# Patient Record
Sex: Female | Born: 1995 | Race: White | Hispanic: No | Marital: Single | Smoking: Never smoker
Health system: Southern US, Community
[De-identification: ages and names within clinical notes are randomized; demographics above are authoritative.]

## PROBLEM LIST (undated history)

## (undated) DIAGNOSIS — K2210 Ulcer of esophagus without bleeding: Secondary | ICD-10-CM

## (undated) DIAGNOSIS — E611 Iron deficiency: Secondary | ICD-10-CM

## (undated) HISTORY — PX: ESOPHAGOGASTRODUODENOSCOPY: SHX1529

---
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
## 2015-10-05 ENCOUNTER — Emergency Department (HOSPITAL_BASED_OUTPATIENT_CLINIC_OR_DEPARTMENT_OTHER): Payer: BLUE CROSS/BLUE SHIELD

## 2015-10-05 ENCOUNTER — Emergency Department (HOSPITAL_BASED_OUTPATIENT_CLINIC_OR_DEPARTMENT_OTHER)
Admission: EM | Admit: 2015-10-05 | Discharge: 2015-10-05 | Disposition: A | Discharge status: Home / Self Care | Payer: BLUE CROSS/BLUE SHIELD | Source: Home / Self Care | Attending: Emergency Medicine | Admitting: Emergency Medicine

## 2015-10-05 ENCOUNTER — Encounter (HOSPITAL_BASED_OUTPATIENT_CLINIC_OR_DEPARTMENT_OTHER): Payer: Self-pay | Admitting: *Deleted

## 2015-10-05 DIAGNOSIS — K208 Other esophagitis: Secondary | ICD-10-CM | POA: Diagnosis not present

## 2015-10-05 DIAGNOSIS — R1310 Dysphagia, unspecified: Secondary | ICD-10-CM | POA: Diagnosis not present

## 2015-10-05 DIAGNOSIS — J020 Streptococcal pharyngitis: Secondary | ICD-10-CM

## 2015-10-05 LAB — BASIC METABOLIC PANEL
Anion gap: 6 (ref 5–15)
BUN: 19 mg/dL (ref 6–20)
CO2: 29 mmol/L (ref 22–32)
Calcium: 8.7 mg/dL — ABNORMAL LOW (ref 8.9–10.3)
Chloride: 103 mmol/L (ref 101–111)
Creatinine, Ser: 0.98 mg/dL (ref 0.44–1.00)
GFR calc Af Amer: >60 mL/min (ref >60)
GFR calc non Af Amer: >60 mL/min (ref >60)
Glucose, Bld: 97 mg/dL (ref 65–99)
Potassium: 3.9 mmol/L (ref 3.5–5.1)
Sodium: 138 mmol/L (ref 135–145)

## 2015-10-05 LAB — RAPID STREP SCREEN (MED CTR MEBANE ONLY): Streptococcus, Group A Screen (Direct): NEGATIVE

## 2015-10-05 MED ORDER — MORPHINE SULFATE (PF) 4 MG/ML IV SOLN
4.0000 mg | Freq: Once | INTRAVENOUS | Status: AC
Start: 2015-10-05 — End: 2015-10-05
  Administered 2015-10-05: 4 mg via INTRAVENOUS
  Filled 2015-10-05: qty 1

## 2015-10-05 MED ORDER — ACETAMINOPHEN 160 MG/5ML PO SOLN
650.0000 mg | Freq: Once | ORAL | Status: AC
  Administered 2015-10-05: 650 mg via ORAL
  Filled 2015-10-05: qty 20.3

## 2015-10-05 MED ORDER — IOHEXOL 300 MG/ML  SOLN
80.0000 mL | Freq: Once | INTRAMUSCULAR | Status: AC | PRN
  Administered 2015-10-05: 80 mL via INTRAVENOUS

## 2015-10-05 MED ORDER — LIDOCAINE VISCOUS 2 % MT SOLN
15.0000 mL | Freq: Once | OROMUCOSAL | Status: AC
  Administered 2015-10-05: 15 mL via OROMUCOSAL
  Filled 2015-10-05: qty 15

## 2015-10-05 MED ORDER — FAMOTIDINE 20 MG PO TABS: 20.0000 mg | ORAL_TABLET | Freq: Two times a day (BID) | ORAL | Status: DC

## 2015-10-05 MED ORDER — KETOROLAC TROMETHAMINE 30 MG/ML IJ SOLN
30.0000 mg | Freq: Once | INTRAMUSCULAR | Status: AC
  Administered 2015-10-05: 30 mg via INTRAVENOUS
  Filled 2015-10-05: qty 1

## 2015-10-05 MED ORDER — KETOROLAC TROMETHAMINE 10 MG PO TABS: 10.0000 mg | ORAL_TABLET | Freq: Four times a day (QID) | ORAL | Status: DC | PRN

## 2015-10-05 MED ORDER — ONDANSETRON HCL 4 MG/2ML IJ SOLN
4.0000 mg | Freq: Once | INTRAMUSCULAR | Status: AC
  Administered 2015-10-05: 4 mg via INTRAVENOUS
  Filled 2015-10-05: qty 2

## 2015-10-05 MED ORDER — ACETAMINOPHEN 325 MG PO TABS
650.0000 mg | ORAL_TABLET | Freq: Once | ORAL | Status: DC
Start: 2015-10-05 — End: 2015-10-05
  Filled 2015-10-05: qty 2

## 2015-10-05 MED ORDER — LIDOCAINE VISCOUS 2 % MT SOLN: 20.0000 mL | OROMUCOSAL | Status: DC | PRN

## 2015-10-05 MED ORDER — SODIUM CHLORIDE 0.9 % IV BOLUS (SEPSIS)
1000.0000 mL | Freq: Once | INTRAVENOUS | Status: AC
  Administered 2015-10-05: 1000 mL via INTRAVENOUS

## 2015-10-05 MED ORDER — FAMOTIDINE IN NACL 20-0.9 MG/50ML-% IV SOLN
20.0000 mg | Freq: Once | INTRAVENOUS | Status: AC
  Administered 2015-10-05: 20 mg via INTRAVENOUS
  Filled 2015-10-05: qty 50

## 2015-10-05 NOTE — Discharge Instructions (Signed)
Take your medications as prescribed. Continue taking your prescription for amoxicillin as prescribed until your finished the course of antibiotics. I recommend following up with ENT if you continue to have pain after taking a course of antibiotics. Return to the emergency department if symptoms worsen or new onset of fever, drooling, difficulty breathing, coughing up/vomiting blood.   Emergency Department Resource Guide 1) Find a Doctor and Pay Out of Pocket Although you won't have to find out who is covered by your insurance plan, it is a good idea to ask around and get recommendations. You will then need to call the office and see if the doctor you have chosen will accept you as a new patient and what types of options they offer for patients who are self-pay. Some doctors offer discounts or will set up payment plans for their patients who do not have insurance, but you will need to ask so you aren't surprised when you get to your appointment.  2) Contact Your Local Health Department Not all health departments have doctors that can see patients for sick visits, but many do, so it is worth a call to see if yours does. If you don't know where your local health department is, you can check in your phone book. The CDC also has a tool to help you locate your state's health department, and many state websites also have listings of all of their local health departments.  3) Find a Walk-in Clinic If your illness is not likely to be very severe or complicated, you may want to try a walk in clinic. These are popping up all over the country in pharmacies, drugstores, and shopping centers. They're usually staffed by nurse practitioners or physician assistants that have been trained to treat common illnesses and complaints. They're usually fairly quick and inexpensive. However, if you have serious medical issues or chronic medical problems, these are probably not your best option.  No Primary Care Doctor: - Call  Health Connect at  [phone] - they can help you locate a primary care doctor that  accepts your insurance, provides certain services, etc. - Physician Referral Service- [phone]  Chronic Pain Problems: Organization         Address  Phone   Notes  [facility]  [phone] Patients need to be referred by their primary care doctor.   Medication Assistance: Organization         Address  Phone   Notes  [facility] [address] [place], [place] [address] [phone] --Must be a resident of [place] -- Must have NO insurance coverage whatsoever (no Medicaid/ Medicare, etc.) -- The pt. MUST have a primary care doctor that directs their care regularly and follows them in the community   MedAssist  [phone]   [employer]  [phone]) [phone]    Agencies that provide inexpensive medical care: Organization         Address  Phone   Notes  [facility]  [phone]   [facility]    [phone]) [phone]   [facility] [address] [place], [place] [address] [phone]   [facility] [place] [address], [place] [phone]   Planned Parenthood    [phone]   [facility]    [phone]) [phone]   [facility]  [address],  Phone:  [phone]) [phone], Fax:  (  [phone] Hours of Operation:  9 am - 6 pm, M-F.  Also accepts Medicaid/Medicare and self-pay.  [facility]  [address], [place] Phone: [phone], Fax: [phone]. Hours of Operation:  8:30 am - 5:30 pm, M-F.  Also accepts Medicaid and self-pay.  [facility] [place] [address] [place] Phone: [phone]   [facility] [address] Natasha Bence [place], [place] [phone], Ext. [phone] Mondays & Thursdays: 7-9 AM.  First 15 patients are seen on a first come, first serve  basis.    Medicaid-accepting [facility] Providers:  Organization         Address  Phone   Notes  [facility] [address], [place] [phone] Also accepts self-pay patients.  [facility] [address] Laurell Josephs [place], [place]  [phone]   [facility] [address], [place] [phone]   [facility] [address] [place] [phone]   Renaye Rakers [address], [place]   [phone] Only accepts [place] patients after they have their name applied to their card.   Self-Pay (no insurance) in [facility]:  Organization         Address  Phone   Notes  Sickle Cell Patients, [facility] [address] [employer] [place], [place] [phone]   [facility] [address] [place], [place] [phone]   [facility]  [address], [place] [phone]   [facility]/Dr. Osei-Bonsu  [address], [place] or [address], [place] [phone] Phone number for both [place] and [place] locations is the same.  [facility] [address] [place] [phone]   [facility] [address], [place] or [address] [phone]   [facility] [address], [place] [phone], phone; [phone], fax Sees patients 1st and 3rd Saturday of every month.  Must not qualify for public or private insurance (i.e. Medicaid, Medicare, [place] Health Choice, Veterans' Benefits)  Household income should be no more than 200% of the poverty level The clinic cannot treat you if you are pregnant or think you are pregnant  Sexually transmitted diseases are not treated at the clinic.    Dental Care: Organization         Address  Phone  Notes  [facility] [address] [place], [place] [phone] Accepts children up to age 42 who are enrolled in [place] or [place] Health Choice; pregnant women with a Medicaid card; and children who have applied for Medicaid or [place] Health Choice, but were declined, whose parents can pay a reduced fee at time of service.  [facility]  [address], [place] [phone] Accepts children up to age 69 who are enrolled in [place] or [place] Health Choice; pregnant women with a Medicaid card; and children who have applied for Medicaid or [place] Health Choice, but were declined, whose parents can pay a reduced fee at time of service.  [facility]  [address] [place], [place] [phone] Patients are seen by appointment only. Walk-ins are not accepted. [facility] will see patients 76 years of age and older. Monday - Tuesday (8am-5pm) Most Wednesdays (8:30-5pm) $30 per visit, cash only  [facility]  [address], [facility] [phone] Patients are seen by appointment only. Walk-ins are not accepted. [place] will see patients 23 years of age and older. One Wednesday Evening (Monthly: Volunteer Based).  $30 per visit, cash only  [place]  [phone] for adults; Children under age 29, call Graduate Pediatric Dentistry at [phone]. Children aged 14-14, please call [phone] to request a pediatric application.  Dental services are provided in all areas of dental care including fillings, crowns and bridges, complete and partial dentures, implants, gum treatment, root canals, and extractions. Preventive care is also provided. Treatment is provided to both adults and children. Patients are selected via a lottery and there is often a waiting list.   [facility] [address] [place]  [phone]  [email or web address]   [facility] [address] [place], [place] [phone], Ext. [phone] Second and Fourth Thursday of each month, opens at 6:30 AM; Clinic ends at 9 AM.  Patients are seen on a first-come first-served basis, and a limited number are seen during each clinic.   [facility]  [address] Hillard Danker [place], [place] [phone]   Eligibility Requirements You must have lived in [place], [place], or [place] counties for at least the last three months.   You cannot be eligible for state or federal sponsored [employer], including [employer], [place], or [employer].   You generally cannot be eligible for healthcare insurance through your employer.    How to apply: Eligibility screenings are held every Tuesday and Wednesday afternoon from 1:00 pm until 4:00 pm. You do not need an appointment for the interview!  [facility] [address] [place], [place]   [place]  [place] Department  [place]  [phone]    Behavioral Health Resources in the Community: Intensive Outpatient Programs Organization         Address  Phone  Notes  [place]. [address], [place], [place] [phone]   [facility] Outpatient [address] [place], [place]   ADS: Alcohol & Drug Svcs [address], [place], [place]   [place] [address]  [place], [place] or [phone]   Substance Abuse Resources Organization         Address  Phone  Notes  Alcohol and Drug Services  [phone]   [place]  [phone]   The [place]   Chinita Pester  [phone]   Residential & Outpatient Substance Abuse Program  [phone]   Psychological Services Organization          Address  Phone  Notes  [facility] [place]  [place]  [phone]   [place] [address] [place] or [phone]    Mobile Crisis Teams Organization         Address  Phone  Notes  [facility], Mobile Crisis Care Unit  [phone]   [facility]  [address] [place], [place]   [facility] [address] [place] [phone]    Self-Help/Support Groups Organization  Address  Phone             Notes  [facility] of [place] - variety of support groups  [phone] Call for more information  Narcotics Anonymous (NA), [facility] [address], [place]  2 meetings at this location   Statisticianesidential Treatment Programs Organization         Address  Phone  Notes  [facility] [address],    [place]  [phone]   [facility]  [address], [place] [phone], [place], [place] [phone]   [facility] [address], [place] [phone] Admissions: 8am-3pm M-F  [facility] [address]    [place], [place] [phone]   [facility] [address], [place], [place] [phone]   [facility] [address]  [place], [place] [phone]   [facility] - Intensive Outpatient [address], [place], [place] [phone]   [facility] ([facility] Recovery [facility] [address]  [place], [place] [phone] or [phone]   [facility] (RTS) [address] [place], [place] [phone] Accepts Medicaid  [facility] [address]  [place] [phone] Substance Abuse/Addiction Treatment   [facility] Behavioral Health Resources Organization         Address  Phone  Notes  [facility]  [phone](888) [phone]   Angie FavaJulie Brannon, PhD [address], [address] [place], [place]   [phone]) [phone] or [phone]) [phone]   [facility]   [address] [place], [place] [phone](336) [phone]   [facility] [address], [place], [place] [phone] Insurance/Medicaid/sponsorship through [facility]  Faith and Families [address]                                    [place], [place] [phone]) [phone] Therapy/tele-psych/case  [facility] [address]   [place], [place] [phone]) [phone]    Dr. Lolly MustacheArfeen  [phone]) [phone]   [facility] [facility]  [employer] [facility] 1) [address], [place] 2) [address], [place] 3)  [address], [place] [phone](336) [phone]  [phone]   [facility] Child Abuse Hotline [phone]) [phone] or [phone]) [phone] (After Hours)

## 2015-10-05 NOTE — ED Provider Notes (Signed)
CSN: [id number]     Arrival date & time 10/05/15  1509 History   First MD Initiated Contact with Patient 10/05/15 1800     Chief Complaint  Patient presents with  . Sore Throat     (Consider location/radiation/quality/duration/timing/severity/associated sxs/prior Treatment) HPI   Patient is a 19 year old female who presents to the emergency department with worsening sore throat, onset 5 days. Patient states she recently came home from college started having a sore throat. She states she was seen at an urgent care 4 days ago and was given a Z-Pak. She states due to her symptoms worsening she return to the urgent care this morning, negative chest x-ray, unremarkable EKG, negative strep, negative mono. She was changed from a Z-Pak to amoxicillin and given IM steroids and diagnosed with pleurisy. Due to patient continuing to have pain she decided to come into the emergency department today. She reports having fever, chills, ear pain, nausea and headaches. She also reports having lower midsternal sharp chest pain with swallowing. Denies cough, difficulty breathing, drooling, facial/neck swelling, wheezing, palpitations, abdominal pain, vomiting, diarrhea, urinary symptoms. She notes she has been taking ibuprofen without relief.  History reviewed. No pertinent past medical history. History reviewed. No pertinent past surgical history. History reviewed. No pertinent family history. Social History  Substance Use Topics  . Smoking status: Never Smoker   . Smokeless tobacco: None  . Alcohol Use: No   OB History    No data available     Review of Systems  Constitutional: Positive for fever and chills.  HENT: Positive for ear pain and sore throat.   Respiratory: Positive for chest tightness.   Gastrointestinal: Positive for nausea.  Neurological: Positive for headaches.  All other systems reviewed and are negative.     Allergies  Review of patient's allergies indicates no known  allergies.  Home Medications   Prior to Admission medications   Medication Sig Start Date End Date Taking? Authorizing Provider  amoxicillin (AMOXIL) 875 MG tablet Take 875 mg by mouth 2 (two) times daily.   Yes Historical Provider, MD  famotidine (PEPCID) 20 MG tablet Take 1 tablet (20 mg total) by mouth 2 (two) times daily. 10/05/15   Barrett HenleNicole Elizabeth Montina Dorrance, PA-C  ketorolac (TORADOL) 10 MG tablet Take 1 tablet (10 mg total) by mouth every 6 (six) hours as needed. 10/05/15   Barrett HenleNicole Elizabeth Braven Wolk, PA-C  lidocaine (XYLOCAINE) 2 % solution Use as directed 20 mLs in the mouth or throat as needed for mouth pain. 10/05/15   Satira SarkNicole Elizabeth Raymundo Rout, PA-C   BP 121/69 mmHg  Pulse 66  Temp(Src) 98.6 F (37 C) (Oral)  Resp 18  Ht 5\' 6"  (1.676 m)  Wt 68.04 kg  BMI 24.22 kg/m2  SpO2 96%  LMP 09/09/2015 Physical Exam  Constitutional: She is oriented to person, place, and time. She appears well-developed and well-nourished.  HENT:  Head: Normocephalic and atraumatic.  Right Ear: Tympanic membrane normal.  Left Ear: Tympanic membrane normal.  Nose: Nose normal. Right sinus exhibits no maxillary sinus tenderness and no frontal sinus tenderness. Left sinus exhibits no maxillary sinus tenderness and no frontal sinus tenderness.  Mouth/Throat: Uvula is midline and mucous membranes are normal. Oropharyngeal exudate (white exudate noted to bilateral tonsils), posterior oropharyngeal edema and posterior oropharyngeal erythema present. No tonsillar abscesses.  Eyes: Conjunctivae and EOM are normal. Right eye exhibits no discharge. Left eye exhibits no discharge. No scleral icterus.  Neck: Normal range of motion. Neck supple.  Cardiovascular: Normal rate,  regular rhythm, normal heart sounds and intact distal pulses.   Pulmonary/Chest: Effort normal and breath sounds normal. No respiratory distress. She has no wheezes. She has no rales. She exhibits tenderness (midsternal chest wall mildly TTP).   Abdominal: Soft. Bowel sounds are normal. She exhibits no distension and no mass. There is no tenderness. There is no rebound and no guarding.  Musculoskeletal: She exhibits no edema.  Lymphadenopathy:    She has no cervical adenopathy.  Neurological: She is alert and oriented to person, place, and time.  Skin: Skin is warm and dry.  Nursing note and vitals reviewed.   ED Course  Procedures (including critical care time) Labs Review Labs Reviewed  BASIC METABOLIC PANEL - Abnormal; Notable for the following:    Calcium 8.7 (*)    All other components within normal limits  RAPID STREP SCREEN (NOT AT [facility])  CULTURE, GROUP A STREP    Imaging Review Ct Soft Tissue Neck W Contrast  10/05/2015  CLINICAL DATA:  Chest pain and sore throat EXAM: CT NECK WITH CONTRAST TECHNIQUE: Multidetector CT imaging of the neck was performed using the standard protocol following the bolus administration of intravenous contrast. CONTRAST:  80mL OMNIPAQUE IOHEXOL 300 MG/ML  SOLN COMPARISON:  None. FINDINGS: Pharynx and larynx: No mass or fluid collection identified. The prevertebral soft tissues appear normal. Salivary glands: The parotid glands and submandibular glands appear symmetric. Thyroid: Normal appearance of the thyroid gland. Lymph nodes: Right level-II lymph node measures 1.3 cm, image number 46 of series 2. Vascular: Patent.  Unremarkable. Limited intracranial: Unremarkable. Visualized orbits: The orbits appear normal. Mastoids and visualized paranasal sinuses: The mastoid air cells are clear. There is mild mucosal thickening involving the left maxillary sinus Skeleton: Unremarkable. Upper chest: Lungs are clear. Superior mediastinum appears within normal limits. IMPRESSION: 1. No mass or evidence of abscess identified within the soft tissues of the neck. 2. Enlarged right level 2 lymph node is identified. Likely reactive. Electronically Signed   By: Signa Kell M.D.   On: 10/05/2015 20:42   I have  personally reviewed and evaluated these images and lab results as part of my medical decision-making.   EKG Interpretation None      MDM   Final diagnoses:  Strep pharyngitis    Patient presents with worsening sore throat. Endorses associated fever and chest pain with swallowing. She is been seen multiple times in urgent care prior to arrival, negative strep, negative mono, unremarkable EKG and chest x-ray. She has been given IM steroids as recently switched from a Z-Pak to amoxicillin, took her first dose of antibiotic today. VSS. Exam revealed posterior oral pharyngeal swelling and erythema with white exudate noted to bilateral tonsils. No trismus, drooling, neck swelling or stridor on exam. Cardiac exam unremarkable. I was able to review patient's recent chest x-ray which was negative. Patient given IV fluids, pain meds and anti-medics. Rapid strep negative. CT revealed no evidence of abscess, enlarged right level II lymph nodes, otherwise unremarkable. Patient reports her pain has not improved, at that time patient also reports having history of GERD but notes she has not been on any medications. She states when she was in the CT scanner laying supine her chest pain worsen. Patient given second dose of pain meds and Pepcid. Discussed results with patient and family. Although patient's rapid strep was negative, presentation is consistent with strep pharyngitis. Plan to discharge patient home with symptomatic treatment and advised to continue taking her antibiotics as prescribed. Patient given ENT  follow-up as needed with associated continued pain.  Evaluation does not show pathology requring ongoing emergent intervention or admission. Pt is hemodynamically stable and mentating appropriately. Discussed findings/results and plan with patient/guardian, who agrees with plan. All questions answered. Return precautions discussed and outpatient follow up given.       Satira Sark [place],  [place] 10/06/15 0141  Loren Racer, MD 10/10/15 [phone]

## 2015-10-05 NOTE — ED Notes (Signed)
Pt returned from CT °

## 2015-10-05 NOTE — ED Notes (Signed)
Patient transported to CT 

## 2015-10-05 NOTE — ED Notes (Signed)
Pt states awoke with chest tightness followed with temperature, HA, earaches, sore throat, onset on the 22nd DEC, appetite poor.

## 2015-10-05 NOTE — ED Notes (Signed)
Tracheal sounds clear, speech WNL 

## 2015-10-05 NOTE — ED Notes (Signed)
Pt became very nauseated with viscous lidocaine.

## 2015-10-05 NOTE — ED Notes (Signed)
MD at bedside. 

## 2015-10-05 NOTE — ED Notes (Signed)
Pt c/o sore throat and " unable to swallow" x 3 days

## 2015-10-05 NOTE — ED Notes (Signed)
Urgent Care today, Steroid injection. Was started on z-pack, but was changed to Amoxicillin. States feels worse

## 2015-10-06 ENCOUNTER — Encounter (HOSPITAL_COMMUNITY): Payer: Self-pay | Admitting: General Practice

## 2015-10-06 ENCOUNTER — Inpatient Hospital Stay (HOSPITAL_COMMUNITY)
Admission: AD | Admit: 2015-10-06 | Discharge: 2015-10-09 | DRG: 392 | Disposition: A | Discharge status: Home / Self Care | Payer: BLUE CROSS/BLUE SHIELD | Source: Ambulatory Visit | Attending: Internal Medicine | Admitting: Internal Medicine

## 2015-10-06 DIAGNOSIS — K208 Other esophagitis without bleeding: Secondary | ICD-10-CM | POA: Diagnosis present

## 2015-10-06 DIAGNOSIS — D696 Thrombocytopenia, unspecified: Secondary | ICD-10-CM | POA: Diagnosis present

## 2015-10-06 DIAGNOSIS — D509 Iron deficiency anemia, unspecified: Secondary | ICD-10-CM | POA: Diagnosis present

## 2015-10-06 DIAGNOSIS — E860 Dehydration: Secondary | ICD-10-CM | POA: Diagnosis present

## 2015-10-06 DIAGNOSIS — R1310 Dysphagia, unspecified: Secondary | ICD-10-CM | POA: Diagnosis not present

## 2015-10-06 DIAGNOSIS — B0089 Other herpesviral infection: Secondary | ICD-10-CM | POA: Diagnosis present

## 2015-10-06 DIAGNOSIS — K209 Esophagitis, unspecified: Secondary | ICD-10-CM

## 2015-10-06 DIAGNOSIS — E8809 Other disorders of plasma-protein metabolism, not elsewhere classified: Secondary | ICD-10-CM | POA: Diagnosis present

## 2015-10-06 DIAGNOSIS — K2210 Ulcer of esophagus without bleeding: Secondary | ICD-10-CM | POA: Diagnosis present

## 2015-10-06 DIAGNOSIS — Z79899 Other long term (current) drug therapy: Secondary | ICD-10-CM

## 2015-10-06 HISTORY — DX: Ulcer of esophagus without bleeding: K22.10

## 2015-10-06 HISTORY — DX: Iron deficiency: E61.1

## 2015-10-06 LAB — CBC WITH DIFFERENTIAL/PLATELET
Basophils Absolute: 0 10*3/uL (ref 0.0–0.1)
Basophils Relative: 1 %
Eosinophils Absolute: 0 10*3/uL (ref 0.0–0.7)
Eosinophils Relative: 1 %
HCT: 36 % (ref 36.0–46.0)
Hemoglobin: 12.5 g/dL (ref 12.0–15.0)
Lymphocytes Relative: 26 %
Lymphs Abs: 1.6 10*3/uL (ref 0.7–4.0)
MCH: 32.1 pg (ref 26.0–34.0)
MCHC: 34.7 g/dL (ref 30.0–36.0)
MCV: 92.5 fL (ref 78.0–100.0)
Monocytes Absolute: 0.5 10*3/uL (ref 0.1–1.0)
Monocytes Relative: 8 %
Neutro Abs: 4 10*3/uL (ref 1.7–7.7)
Neutrophils Relative %: 64 %
Platelets: 135 10*3/uL — ABNORMAL LOW (ref 150–400)
RBC: 3.89 MIL/uL (ref 3.87–5.11)
RDW: 12 % (ref 11.5–15.5)
WBC: 6.1 10*3/uL (ref 4.0–10.5)

## 2015-10-06 LAB — COMPREHENSIVE METABOLIC PANEL
ALT: 13 U/L — ABNORMAL LOW (ref 14–54)
AST: 19 U/L (ref 15–41)
Albumin: 3.4 g/dL — ABNORMAL LOW (ref 3.5–5.0)
Alkaline Phosphatase: 42 U/L (ref 38–126)
Anion gap: 8 (ref 5–15)
BUN: 18 mg/dL (ref 6–20)
CO2: 28 mmol/L (ref 22–32)
Calcium: 8.9 mg/dL (ref 8.9–10.3)
Chloride: 104 mmol/L (ref 101–111)
Creatinine, Ser: 1.02 mg/dL — ABNORMAL HIGH (ref 0.44–1.00)
GFR calc Af Amer: >60 mL/min (ref >60)
GFR calc non Af Amer: >60 mL/min (ref >60)
Glucose, Bld: 88 mg/dL (ref 65–99)
Potassium: 4.1 mmol/L (ref 3.5–5.1)
Sodium: 140 mmol/L (ref 135–145)
Total Bilirubin: 0.9 mg/dL (ref 0.3–1.2)
Total Protein: 6.4 g/dL — ABNORMAL LOW (ref 6.5–8.1)

## 2015-10-06 MED ORDER — DEXTROSE 5 % IV SOLN
600.0000 mg | Freq: Three times a day (TID) | INTRAVENOUS | Status: DC
  Administered 2015-10-06 – 2015-10-09 (×9): 600 mg via INTRAVENOUS
  Filled 2015-10-06 (×14): qty 12

## 2015-10-06 MED ORDER — ONDANSETRON HCL 4 MG/2ML IJ SOLN: 4.0000 mg | Freq: Four times a day (QID) | INTRAMUSCULAR | Status: DC | PRN

## 2015-10-06 MED ORDER — MORPHINE SULFATE (PF) 2 MG/ML IV SOLN
1.0000 mg | INTRAVENOUS | Status: DC | PRN
  Administered 2015-10-06 – 2015-10-08 (×3): 2 mg via INTRAVENOUS
  Filled 2015-10-06 (×3): qty 1

## 2015-10-06 MED ORDER — ENOXAPARIN SODIUM 40 MG/0.4ML ~~LOC~~ SOLN: 40.0000 mg | SUBCUTANEOUS | Status: DC

## 2015-10-06 MED ORDER — GI COCKTAIL ~~LOC~~: 30.0000 mL | Freq: Once | ORAL | Status: DC

## 2015-10-06 MED ORDER — KETOROLAC TROMETHAMINE 15 MG/ML IJ SOLN
15.0000 mg | Freq: Four times a day (QID) | INTRAMUSCULAR | Status: DC
  Administered 2015-10-06 – 2015-10-07 (×4): 15 mg via INTRAVENOUS
  Filled 2015-10-06 (×4): qty 1

## 2015-10-06 MED ORDER — POTASSIUM CHLORIDE IN NACL 20-0.9 MEQ/L-% IV SOLN
INTRAVENOUS | Status: DC
  Administered 2015-10-06 – 2015-10-08 (×5): via INTRAVENOUS
  Filled 2015-10-06 (×9): qty 1000

## 2015-10-06 MED ORDER — PROMETHAZINE HCL 25 MG/ML IJ SOLN: 25.0000 mg | Freq: Four times a day (QID) | INTRAMUSCULAR | Status: DC | PRN

## 2015-10-06 NOTE — H&P (Signed)
[facility] History and Physical                                                                                    Molly Castillo, is a 19 y.o. female  MRN: [id number]   DOB - 03/13/1996  Admit Date - 10/06/2015  Outpatient Primary MD for the patient is No primary care provider on file.  Referring MD: [facility] ED/Nadeau, PA-C  PMH: Past Medical History  Diagnosis Date  . Iron deficiency   . Esophageal ulcer       PSH: Past Surgical History  Procedure Laterality Date  . Esophagogastroduodenoscopy       CC: Severe odynophagia    HPI: 19 year old otherwise healthy female patient who has been experiencing severe sore throat and inability to eat or drink for greater than 48 hours. Patient's symptoms initially began about 5-6 days ago with fever up to 103F with associated sore throat and earache and tightness in her throat and chest with attempts to swallow. She had been evaluated by her PCP and had been given IM steroids as well as antibiotics. She was initially given a Z-Pak and this was switched to amoxicillin but strep test has been negative 2. She was treated with Toradol and IV fluids as well as medicines to prevent nausea and vomiting without success. She has been to the ER twice. Initial exam revealed oropharyngeal swelling and erythema and white exudate to the bilateral tonsils. She was able to control oral secretions. CT of the neck and soft tissues revealed no evidence of posterior pharyngeal or other abscess. She has had weight loss of 8 pounds in 5 days. Patient has also been evaluated by Dr. Loreta Ave with GI and has undergone an EGD which revealed severe ulcerative esophagitis concerning for herpetic etiology. Because she was unable to eat or drink and was clinically dehydrated it was recommended patient be admitted to begin IV acyclovir, IV pain management and IV hydration.  ER Evaluation and treatment: 12/26: CT of the neck with contrast-no mass or evidence of abscess in  the soft tissues of the neck; enlarged right level II lymph node presumed reactive Electrolyte panel unremarkable Rapid strep negative Group A strep culture pending Parents report outpatient influenza screen negative Outpatient EGD: Ulcerative esophagitis concerning for herpetic etiology  Review of Systems   In addition to the HPI above,  No Headache, changes with Vision or hearing, new weakness, tingling, numbness in any extremity, No Cough or Shortness of Breath, palpitations, orthopnea or DOE No Abdominal pain, no melena or hematochezia, no dark tarry stools, Bowel movements are regular, No dysuria, hematuria or flank pain No new skin rashes, lesions, masses or bruises, No new joints pains-aches No polyuria, polydypsia or polyphagia,  *A full 10 point Review of Systems was done, except as stated above, all other Review of Systems were negative.  Social History Social History  Substance Use Topics  . Smoking status: Never Smoker   . Smokeless tobacco: Never Used  . 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 No family history that would be pertinent to current admission and clinical status.   Prior to Admission medications   Medication Sig Start Date End Date Taking? Authorizing Provider  amoxicillin (AMOXIL) 875 MG tablet Take 875 mg by mouth 2 (two) times daily.    Historical Provider, MD  famotidine (PEPCID) 20 MG tablet Take 1 tablet (20 mg total) by mouth 2 (two) times daily. 10/05/15   Barrett Henle, PA-C  ketorolac (TORADOL) 10 MG tablet Take 1 tablet (10 mg total) by mouth every 6 (six) hours as needed. 10/05/15   Barrett Henle, PA-C  lidocaine (XYLOCAINE) 2 % solution Use as directed 20 mLs in the mouth or throat as needed for mouth pain. 10/05/15   Barrett Henle, PA-C    No Known Allergies  Physical Exam  Vitals  Last menstrual period 10/05/2015.   General:  In no acute distress, appears healthy and well nourished  Psych:  Normal affect, Denies Suicidal or Homicidal ideations, Awake Alert, Oriented X 3. Speech and thought patterns are clear and appropriate, no apparent short term memory deficits  Neuro:   No focal neurological deficits, CN II through XII intact, Strength 5/5 all 4 extremities, Sensation intact all 4 extremities.  ENT:  Ears and Eyes appear Normal, Conjunctivae clear, PER. Dry oral mucosa without obvious erythema or exudates. Halitosis.  Neck:  Supple, No lymphadenopathy appreciated  Respiratory:  Symmetrical chest wall movement, Good air movement bilaterally, CTAB. Room Air  Cardiac:  RRR, No Murmurs, no LE edema noted, no JVD, No carotid bruits, peripheral pulses palpable at 2+  Abdomen:  Positive bowel sounds, Soft, Non tender, Non distended,  No masses appreciated, no obvious hepatosplenomegaly  Skin:  No Cyanosis, Normal Skin Turgor, No Skin Rash or Bruise.  Extremities: Symmetrical without obvious trauma or injury,  no effusions.  Data Review  CBC No results for input(s): WBC, HGB, HCT, PLT, MCV, MCH, MCHC, RDW, LYMPHSABS, MONOABS, EOSABS, BASOSABS, BANDABS in the last 168 hours.  Invalid input(s): NEUTRABS, BANDSABD  Chemistries   Recent Labs Lab 10/05/15 1925  NA 138  K 3.9  CL 103  CO2 29  GLUCOSE 97  BUN 19  CREATININE 0.98  CALCIUM 8.7*    estimated creatinine clearance is 85.7 mL/min (by C-G formula based on Cr of 0.98).  No results for input(s): TSH, T4TOTAL, T3FREE, THYROIDAB in the last 72 hours.  Invalid input(s): FREET3  Coagulation profile No results for input(s): INR, PROTIME in the last 168 hours.  No results for input(s): DDIMER in the last 72 hours.  Cardiac Enzymes No results for input(s): CKMB, TROPONINI, MYOGLOBIN in the last 168 hours.  Invalid input(s):  CK  Invalid input(s): POCBNP  Urinalysis No results found for: COLORURINE, APPEARANCEUR, LABSPEC, PHURINE, GLUCOSEU, HGBUR, BILIRUBINUR, KETONESUR, PROTEINUR, UROBILINOGEN, NITRITE, LEUKOCYTESUR  Imaging results:   Ct Soft Tissue Neck W Contrast  10/05/2015  CLINICAL DATA:  Chest pain and sore throat EXAM: CT NECK WITH CONTRAST TECHNIQUE: Multidetector CT imaging of the neck was performed using the standard protocol following the bolus administration of intravenous contrast. CONTRAST:  80mL OMNIPAQUE IOHEXOL 300 MG/ML  SOLN COMPARISON:  None. FINDINGS: Pharynx and larynx: No mass or fluid collection identified. The prevertebral soft tissues appear normal. Salivary glands: The parotid glands and submandibular glands appear symmetric. Thyroid: Normal appearance of the thyroid gland. Lymph nodes: Right level-II lymph node measures 1.3 cm, image number 46 of series 2. Vascular: Patent.  Unremarkable. Limited intracranial:  Unremarkable. Visualized orbits: The orbits appear normal. Mastoids and visualized paranasal sinuses: The mastoid air cells are clear. There is mild mucosal thickening involving the left maxillary sinus Skeleton: Unremarkable. Upper chest: Lungs are clear. Superior mediastinum appears within normal limits. IMPRESSION: 1. No mass or evidence of abscess identified within the soft tissues of the neck. 2. Enlarged right level 2 lymph node is identified. Likely reactive. Electronically Signed   By: Signa Kellaylor  Stroud M.D.   On: 10/05/2015 20:42      Assessment & Plan  Principal Problem:   Dehydration, severe -Floor/observation -Appears to be precipitated by severe odynophagia in setting of presumed herpes simplex esophagitis -Treat underlying causes -IV fluids with potassium -Follow labs  Active Problems:   Herpes simplex esophagitis -IV acyclovir -IV Toradol for mild to moderate pain and IV morphine for severe pain -Chek HIV, RPR, HSV 1 and 2-if any of the STD labs are positive  we'll need to obtain extensive sexual history from patient (her parents were at the bedside during initial H/P) -Follow up on CBC with differential-report received patient has had some chronic unexplained low lymphocytes -Clear liquids ordered with patient aware to start with sips; she is also where she cannot discharge home until she can tolerate more than clear liquids    DVT Prophylaxis: Lovenox  Family Communication:   Both parents at bedside  Code Status:  Full code  Condition:  Stable  Discharge disposition: Anticipate discharge in next 24-48 hours once patient able to tolerate solid diet  Time spent in minutes : 60      ELLIS,ALLISON L. ANP on 10/06/2015 at 4:28 PM  You may contact me by going to [email or web address] - password [email or web address]  I am available from 7a-7p but please confirm I am on the schedule by going to Amion as above.   After 7p please contact night coverage person covering me after hours  [facility]

## 2015-10-06 NOTE — Progress Notes (Signed)
19 year old female without significant past medical history, presents with complaints of dysphagia/odynophagia, endoscopy at Dr. 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 - Accepted to MedSurg bed. - Dr Loreta AveMann: [phone]: [phone] Molly Bienenstockawood Marquett Bertoli MD

## 2015-10-06 NOTE — Progress Notes (Signed)
ANTIBIOTIC CONSULT NOTE - INITIAL  Pharmacy Consult for Acyclovir IV Indication: severe herpetic esophagitis  No Known Allergies  Patient Measurements: Actual BW: 68 kg   Labs:  Recent Labs  10/05/15 1925  CREATININE 0.98    Assessment: 19 yo female presented to ED with presents with complaints of continuous dysphagia/odynophagia. S/p EGD that showed severe herpetic esophagitis. Pharmacy to dose IV acyclovir.  Goal of Therapy:  Treatment of infection  Plan:  1. Based on severity of infection will start patient on IV Acyclovir 600 mg ( ~ 10 mg/kg IBW) every 8 hours 2. Following daily; notes prn   Pollyann SamplesAndy Ethaniel Garfield, PharmD, BCPS 10/06/2015, 4:07 PM Pager: [phone]

## 2015-10-07 DIAGNOSIS — D696 Thrombocytopenia, unspecified: Secondary | ICD-10-CM | POA: Diagnosis present

## 2015-10-07 DIAGNOSIS — K208 Other esophagitis: Secondary | ICD-10-CM | POA: Diagnosis present

## 2015-10-07 DIAGNOSIS — R1310 Dysphagia, unspecified: Secondary | ICD-10-CM | POA: Diagnosis present

## 2015-10-07 DIAGNOSIS — E860 Dehydration: Secondary | ICD-10-CM

## 2015-10-07 DIAGNOSIS — B0089 Other herpesviral infection: Secondary | ICD-10-CM | POA: Diagnosis present

## 2015-10-07 DIAGNOSIS — Z79899 Other long term (current) drug therapy: Secondary | ICD-10-CM | POA: Diagnosis not present

## 2015-10-07 DIAGNOSIS — E8809 Other disorders of plasma-protein metabolism, not elsewhere classified: Secondary | ICD-10-CM | POA: Diagnosis present

## 2015-10-07 DIAGNOSIS — K2210 Ulcer of esophagus without bleeding: Secondary | ICD-10-CM | POA: Diagnosis present

## 2015-10-07 DIAGNOSIS — D509 Iron deficiency anemia, unspecified: Secondary | ICD-10-CM | POA: Diagnosis present

## 2015-10-07 LAB — COMPREHENSIVE METABOLIC PANEL
ALT: 12 U/L — AB (ref 14–54)
ANION GAP: 6 (ref 5–15)
AST: 16 U/L (ref 15–41)
Albumin: 2.9 g/dL — ABNORMAL LOW (ref 3.5–5.0)
Alkaline Phosphatase: 37 U/L — ABNORMAL LOW (ref 38–126)
BUN: 19 mg/dL (ref 6–20)
CHLORIDE: 106 mmol/L (ref 101–111)
CO2: 29 mmol/L (ref 22–32)
CREATININE: 0.98 mg/dL (ref 0.44–1.00)
Calcium: 8.6 mg/dL — ABNORMAL LOW (ref 8.9–10.3)
GFR calc non Af Amer: >60 mL/min (ref >60)
Glucose, Bld: 89 mg/dL (ref 65–99)
POTASSIUM: 4.4 mmol/L (ref 3.5–5.1)
SODIUM: 141 mmol/L (ref 135–145)
Total Bilirubin: 0.8 mg/dL (ref 0.3–1.2)
Total Protein: 5.4 g/dL — ABNORMAL LOW (ref 6.5–8.1)

## 2015-10-07 LAB — RPR: RPR Ser Ql: NONREACTIVE

## 2015-10-07 LAB — HIV ANTIBODY (ROUTINE TESTING W REFLEX): HIV Screen 4th Generation wRfx: NONREACTIVE

## 2015-10-07 LAB — CBC
HCT: 34.9 % — ABNORMAL LOW (ref 36.0–46.0)
Hemoglobin: 11.7 g/dL — ABNORMAL LOW (ref 12.0–15.0)
MCH: 30.9 pg (ref 26.0–34.0)
MCHC: 33.5 g/dL (ref 30.0–36.0)
MCV: 92.1 fL (ref 78.0–100.0)
PLATELETS: 127 10*3/uL — AB (ref 150–400)
RBC: 3.79 MIL/uL — AB (ref 3.87–5.11)
RDW: 11.8 % (ref 11.5–15.5)
WBC: 5.1 10*3/uL (ref 4.0–10.5)

## 2015-10-07 LAB — HSV 2 ANTIBODY, IGG: HSV 2 Glycoprotein G Ab, IgG: <0.91 index (ref 0.00–0.90)

## 2015-10-07 LAB — PREGNANCY, URINE: Preg Test, Ur: NEGATIVE

## 2015-10-07 LAB — HSV 1 ANTIBODY, IGG: HSV 1 Glycoprotein G Ab, IgG: <0.91 index (ref 0.00–0.90)

## 2015-10-07 MED ORDER — PANTOPRAZOLE SODIUM 40 MG IV SOLR
40.0000 mg | Freq: Every day | INTRAVENOUS | Status: DC
  Administered 2015-10-07: 40 mg via INTRAVENOUS
  Filled 2015-10-07: qty 40

## 2015-10-07 MED ORDER — GI COCKTAIL ~~LOC~~
30.0000 mL | Freq: Three times a day (TID) | ORAL | Status: DC
  Administered 2015-10-07 – 2015-10-08 (×3): 30 mL via ORAL
  Filled 2015-10-07 (×4): qty 30

## 2015-10-07 NOTE — Care Management Note (Signed)
Case Management Note  Patient Details  Name: Molly Castillo MRN: [id number] Date of Birth: 10/31/1995  Subjective/Objective:                  Spoke with patient and her parents at the bedside. Patient is in town for the holidays from [place] she is enrolled in college. Family denies any resources offered. Patient plans to return to [place] for Spring semester. She has MD through school's athletic program, she participates on the swim team   Action/Plan:  No CM needs at this time, will continue to follow.  Expected Discharge Date:                  Expected Discharge Plan:  Home/Self Care  In-House Referral:     Discharge planning Services  CM Consult  Post Acute Care Choice:    Choice offered to:     DME Arranged:    DME Agency:     HH Arranged:    HH Agency:     Status of Service:  Completed, signed off  Medicare Important Message Given:    Date Medicare IM Given:    Medicare IM give by:    Date Additional Medicare IM Given:    Additional Medicare Important Message give by:     If discussed at Long Length of Stay Meetings, dates discussed:    Additional Comments:  Lawerance SabalDebbie Treacy Holcomb, RN 10/07/2015, 2:30 PM

## 2015-10-07 NOTE — H&P (Addendum)
Molly Castillo is an 19 y.o. female.   Chief Complaint: Difficulty swallowing with severe chest pain. HPI: Molly Castillo is a 19 year old white female presents to the office for evaluation of severe chest pain and acute difficulty swallowing with painful swallowing which started 5 days ago. Currently she is unable to swallow even a sip of water. She has had mild dysphagia off and on for the last 6 years. She was in [facility] ED at the [facility] 2 days ago. She was treated for Streptococcal infection on October 02, 2015 and had a fever of 103 degrees when the chest pain started. She is currently taking Amoxicillin even though a Strep test was negative. She was given a steroid injection along with Morphine yesterday. She was given Ketorolac in the ER and she has taken one dose. She just started taking Famotidine over the last few days and has acid reflux off and on for several years. She has had iron deficiency anemia for the last 4 years and her hemoglobin has decreased from 16.3 gm/dl in 12/2010 to 15.5 gm/dl in 04/2012 and 14.7 gm/dl 05/2012 to 11.7 gm/dl today. She had a normal TTG about 2 years ago. She was taking an Iron supplement along with Fish oil, Magnesium and CoQ10 but has stopped taking them. She has 1 BM per day with no obvious blood or mucus in the stool. She is unable to eat due to the acute dysphagia and has lost 8 pounds in the last 5 days. She denies having any abdominal pain, nausea, vomiting or odynophagia. She denies having a family history of colon cancer, celiac sprue or IBD. 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  Past Medical History  Diagnosis Date  . Iron deficiency   . GERD    Past Surgical History  Procedure Laterality Date  . Esophagogastroduodenoscopy     History reviewed. No pertinent family history. Social History:  reports that she has never smoked. She has never used smokeless tobacco. She reports that she does not drink alcohol or use illicit  drugs.  Allergies:  Allergies  Allergen Reactions  . Tree Extract Anaphylaxis and Hives    Throat closes   Medications Prior to Admission  Medication Sig Dispense Refill  . cetirizine (ZYRTEC) 10 MG tablet Take 10 mg by mouth daily as needed for allergies.    . Clindamycin Phos-Benzoyl Perox (ACANYA) gel Take 1 application by mouth at bedtime.    . Coenzyme Q10 (COQ10 PO) Take 1 capsule by mouth daily.    Marland Kitchen EPINEPHRINE, ANAPHYLAXIS THERAPY AGENTS, Inject 1 each into the muscle once. FOR TREE NUTS ALLERGY    . famotidine (PEPCID) 20 MG tablet Take 1 tablet (20 mg total) by mouth 2 (two) times daily. 30 tablet 0  . ibuprofen (ADVIL,MOTRIN) 200 MG tablet Take 400 mg by mouth every 4 (four) hours as needed for moderate pain.    Marland Kitchen IRON-VITAMIN C PO Take 1 tablet by mouth daily.    Marland Kitchen ketorolac (TORADOL) 10 MG tablet Take 1 tablet (10 mg total) by mouth every 6 (six) hours as needed. 20 tablet 0  . lidocaine (XYLOCAINE) 2 % solution Use as directed 20 mLs in the mouth or throat as needed for mouth pain. 100 mL 0  . MAGNESIUM PO Take 250 mg by mouth daily.    . Omega-3 Fatty Acids (FISH OIL) 1000 MG CAPS Take 1,000 mg by mouth 2 (two) times daily.    . TRIAMCINOLONE  ACETONIDE, PF, IJ Inject 1 application as directed once.    Marland Kitchen amoxicillin (AMOXIL) 500 MG capsule Take 500 mg by mouth 2 (two) times daily. Reported on 10/06/2015    . azithromycin (ZITHROMAX) 500 MG tablet Take by mouth daily.     Results for orders placed or performed during the hospital encounter of 10/06/15 (from the past 48 hour(s))  CBC with Differential/Platelet     Status: Abnormal   Collection Time: 10/06/15  4:46 PM  Result Value Ref Range   WBC 6.1 4.0 - 10.5 K/uL   RBC 3.89 3.87 - 5.11 MIL/uL   Hemoglobin 12.5 12.0 - 15.0 g/dL   HCT 36.0 36.0 - 46.0 %   MCV 92.5 78.0 - 100.0 fL   MCH 32.1 26.0 - 34.0 pg   MCHC 34.7 30.0 - 36.0 g/dL   RDW 12.0 11.5 - 15.5 %   Platelets 135 (L) 150 - 400 K/uL   Neutrophils Relative  % 64 %   Neutro Abs 4.0 1.7 - 7.7 K/uL   Lymphocytes Relative 26 %   Lymphs Abs 1.6 0.7 - 4.0 K/uL   Monocytes Relative 8 %   Monocytes Absolute 0.5 0.1 - 1.0 K/uL   Eosinophils Relative 1 %   Eosinophils Absolute 0.0 0.0 - 0.7 K/uL   Basophils Relative 1 %   Basophils Absolute 0.0 0.0 - 0.1 K/uL  HIV antibody     Status: None   Collection Time: 10/06/15  4:46 PM  Result Value Ref Range   HIV Screen 4th Generation wRfx Non Reactive Non Reactive    Comment: (NOTE) Performed At: [facility] [place], [place] [id number] Lindon Romp MD JK:[id number]   RPR     Status: None   Collection Time: 10/06/15  4:46 PM  Result Value Ref Range   RPR Ser Ql Non Reactive Non Reactive    Comment: (NOTE) Performed At: [facility] [address] [place], [place] [id number] Lindon Romp MD KN:[id number]   HSV 1 antibody, IgG     Status: None   Collection Time: 10/06/15  4:46 PM  Result Value Ref Range   HSV 1 Glycoprotein G Ab, IgG <0.91 0.00 - 0.90 index    Comment: (NOTE)                                 Negative        <0.91                                 Equivocal 0.91 - 1.09                                 Positive        >1.09 Note: Negative indicates no antibodies detected to HSV-1. Equivocal may suggest early infection.  If clinically appropriate, retest at later date. Positive indicates antibodies detected to HSV-1. Performed At: [facility] [place], [place] [id number] Lindon Romp MD ZH:[id number]   HSV 2 antibody, IgG     Status: None   Collection Time: 10/06/15  4:46 PM  Result Value Ref Range   HSV 2 Glycoprotein G Ab, IgG <0.91 0.00 - 0.90 index    Comment: (NOTE)  Negative        <0.91                                 Equivocal 0.91 - 1.09                                 Positive        >1.09 Note: Negative indicates no antibodies detected to HSV-2. Equivocal may suggest  early infection.  If clinically appropriate, retest at later date. Positive indicates antibodies detected to HSV-2. Performed At: [facility] [place], [place] [id number] Lindon Romp MD PJ:[id number]   Comprehensive metabolic panel     Status: Abnormal   Collection Time: 10/06/15  4:46 PM  Result Value Ref Range   Sodium 140 135 - 145 mmol/L   Potassium 4.1 3.5 - 5.1 mmol/L   Chloride 104 101 - 111 mmol/L   CO2 28 22 - 32 mmol/L   Glucose, Bld 88 65 - 99 mg/dL   BUN 18 6 - 20 mg/dL   Creatinine, Ser 1.02 (H) 0.44 - 1.00 mg/dL   Calcium 8.9 8.9 - 10.3 mg/dL   Total Protein 6.4 (L) 6.5 - 8.1 g/dL   Albumin 3.4 (L) 3.5 - 5.0 g/dL   AST 19 15 - 41 U/L   ALT 13 (L) 14 - 54 U/L   Alkaline Phosphatase 42 38 - 126 U/L   Total Bilirubin 0.9 0.3 - 1.2 mg/dL   GFR calc non Af Amer >60 >60 mL/min   GFR calc Af Amer >60 >60 mL/min    Comment: (NOTE) The eGFR has been calculated using the CKD EPI equation. This calculation has not been validated in all clinical situations. eGFR's persistently <60 mL/min signify possible Chronic Kidney Disease.    Anion gap 8 5 - 15  CBC     Status: Abnormal   Collection Time: 10/07/15  5:25 AM  Result Value Ref Range   WBC 5.1 4.0 - 10.5 K/uL   RBC 3.79 (L) 3.87 - 5.11 MIL/uL   Hemoglobin 11.7 (L) 12.0 - 15.0 g/dL   HCT 34.9 (L) 36.0 - 46.0 %   MCV 92.1 78.0 - 100.0 fL   MCH 30.9 26.0 - 34.0 pg   MCHC 33.5 30.0 - 36.0 g/dL   RDW 11.8 11.5 - 15.5 %   Platelets 127 (L) 150 - 400 K/uL  Comprehensive metabolic panel     Status: Abnormal   Collection Time: 10/07/15  5:25 AM  Result Value Ref Range   Sodium 141 135 - 145 mmol/L   Potassium 4.4 3.5 - 5.1 mmol/L   Chloride 106 101 - 111 mmol/L   CO2 29 22 - 32 mmol/L   Glucose, Bld 89 65 - 99 mg/dL   BUN 19 6 - 20 mg/dL   Creatinine, Ser 0.98 0.44 - 1.00 mg/dL   Calcium 8.6 (L) 8.9 - 10.3 mg/dL   Total Protein 5.4 (L) 6.5 - 8.1 g/dL   Albumin 2.9 (L) 3.5 - 5.0 g/dL    AST 16 15 - 41 U/L   ALT 12 (L) 14 - 54 U/L   Alkaline Phosphatase 37 (L) 38 - 126 U/L   Total Bilirubin 0.8 0.3 - 1.2 mg/dL   GFR calc non Af Amer >60 >60 mL/min   GFR calc Af Amer >60 >60 mL/min  Comment: (NOTE) The eGFR has been calculated using the CKD EPI equation. This calculation has not been validated in all clinical situations. eGFR's persistently <60 mL/min signify possible Chronic Kidney Disease.    Anion gap 6 5 - 15   Ct Soft Tissue Neck W Contrast  10/05/2015  CLINICAL DATA:  Chest pain and sore throat EXAM: CT NECK WITH CONTRAST TECHNIQUE: Multidetector CT imaging of the neck was performed using the standard protocol following the bolus administration of intravenous contrast. CONTRAST:  12m OMNIPAQUE IOHEXOL 300 MG/ML  SOLN COMPARISON:  None. FINDINGS: Pharynx and larynx: No mass or fluid collection identified. The prevertebral soft tissues appear normal. Salivary glands: The parotid glands and submandibular glands appear symmetric. Thyroid: Normal appearance of the thyroid gland. Lymph nodes: Right level-II lymph node measures 1.3 cm, image number 46 of series 2. Vascular: Patent.  Unremarkable. Limited intracranial: Unremarkable. Visualized orbits: The orbits appear normal. Mastoids and visualized paranasal sinuses: The mastoid air cells are clear. There is mild mucosal thickening involving the left maxillary sinus Skeleton: Unremarkable. Upper chest: Lungs are clear. Superior mediastinum appears within normal limits. IMPRESSION: 1. No mass or evidence of abscess identified within the soft tissues of the neck. 2. Enlarged right level 2 lymph node is identified. Likely reactive. Electronically Signed   By: TKerby MoorsM.D.   On: 10/05/2015 20:42   Review of Systems  Constitutional: Positive for fever, weight loss and malaise/fatigue.  HENT: Negative.   Eyes: Negative.   Respiratory: Negative.   Cardiovascular: Negative.   Gastrointestinal: Positive for heartburn.  Negative for abdominal pain, diarrhea, constipation, blood in stool and melena.  Genitourinary: Negative.   Musculoskeletal: Negative.   Skin: Negative.   Neurological: Positive for weakness.  Endo/Heme/Allergies: Negative.    Blood pressure 113/57, pulse 58, temperature 98 F (36.7 C), temperature source Oral, resp. rate 18, height 5' 6"  (1.676 m), weight 68.04 kg (150 lb), last menstrual period 10/05/2015, SpO2 97 %. Physical Exam  Constitutional: She is oriented to person, place, and time. She appears well-developed and well-nourished.  HENT:  Head: Normocephalic and atraumatic.  Eyes: Conjunctivae and EOM are normal.  Neck: Normal range of motion. Neck supple.  Cardiovascular: Normal rate and regular rhythm.   Respiratory: Effort normal and breath sounds normal.  GI: Soft. Bowel sounds are normal.  Musculoskeletal: Normal range of motion.  Neurological: She is alert and oriented to person, place, and time.  Skin: Skin is warm and dry.  Psychiatric: She has a normal mood and affect. Her behavior is normal. Judgment and thought content normal.    Assessment/Plan 1) Acute dysphagia with odynophagia-multiple ulcers in the esophagus and in the pharynx-on Acyclovir. We need to stop the use of ALL NSAIDS. Will discontinue Ketorolac.  2) Iron deficiency anemia-may be due to heavy cycles.  3) GERD: Will start Protonix IV. 4) Hypoalbuminemia. 5) Low platelets-needs close follow up.    Molly Castillo 10/07/2015, 12:17 PM

## 2015-10-07 NOTE — Progress Notes (Signed)
Patient Demographics:    Molly Castillo, is a 19 y.o. female, DOB - 01/19/1996, WUX:[id number]  Admit date - 10/06/2015   Admitting Physician Starleen Armsawood S Elgergawy, MD  Outpatient Primary MD for the patient is No primary care provider on file.  LOS - 1   No chief complaint on file.       Subjective:    Molly Castillo today has, No headache, No chest pain, No abdominal pain - No Nausea, No new weakness tingling or numbness, No Cough - SOB. Still has throat pain and odynophagia.    Assessment  & Plan :     1.Odynophagia - clinically appears Herpes esophagitis, patient EGD by Dr. Loreta AveMann. Admitted here for IV acyclovir and hydration, unable to swallow much without discomfort. Clear liquids as tolerated, HIV and HSV serology negative, denies any sexual exposure, GI to evaluate discussed with Dr. Loreta AveMann today. Tinea GI cocktail and supportive care.   2. Dehydration. Resolved after IV fluids continue gentle hydration.    Code Status : Full  Family Communication  : parents  Disposition Plan  : Home 1-2 days  Consults  :  GI - Mann  Procedures  :    DVT Prophylaxis  :  Lovenox    Lab Results  Component Value Date   PLT 127* 10/07/2015    Inpatient Medications  Scheduled Meds: . acyclovir  600 mg Intravenous 3 times per day  . enoxaparin (LOVENOX) injection  40 mg Subcutaneous Q24H  . gi cocktail  30 mL Oral TID  . ketorolac  15 mg Intravenous 4 times per day   Continuous Infusions: . 0.9 % NaCl with KCl 20 mEq / L 125 mL/hr at 10/07/15 0202   PRN Meds:.morphine injection, ondansetron (ZOFRAN) IV **OR** promethazine  Antibiotics  :     Anti-infectives    Start     Dose/Rate Route Frequency Ordered Stop   10/06/15 1630  acyclovir (ZOVIRAX) 600 mg in dextrose 5 % 100 mL IVPB     600  mg 112 mL/hr over 60 Minutes Intravenous 3 times per day 10/06/15 1606          Objective:   Filed Vitals:   10/06/15 1900 10/06/15 2139 10/07/15 0539  BP:  129/70 113/57  Pulse:  77 58  Temp:  99.1 F (37.3 C) 98 F (36.7 C)  TempSrc:  Oral Oral  Resp:  18 18  Height: 5\' 6"  (1.676 m)    Weight: 68.04 kg (150 lb)    SpO2:  98% 97%    Wt Readings from Last 3 Encounters:  10/06/15 68.04 kg (150 lb)  10/05/15 68.04 kg (150 lb)    No intake or output data in the 24 hours ending 10/07/15 0842   Physical Exam  Awake Alert, Oriented X 3, No new F.N deficits, Normal affect [place].AT,PERRAL Supple Neck,No JVD, No cervical lymphadenopathy appriciated.  Symmetrical Chest wall movement, Good air movement bilaterally, CTAB RRR,No Gallops,Rubs or new Murmurs, No Parasternal Heave +ve B.Sounds, Abd Soft, No tenderness, No organomegaly appriciated, No rebound - guarding or rigidity. No Cyanosis, Clubbing or edema, No new Rash or bruise       Data Review:   Micro Results Recent Results (from the past 240 hour(s))  Rapid  strep screen     Status: None   Collection Time: 10/05/15  6:06 PM  Result Value Ref Range Status   Streptococcus, Group A Screen (Direct) NEGATIVE NEGATIVE Final    Comment: (NOTE) A Rapid Antigen test may result negative if the antigen level in the sample is below the detection level of this test. The FDA has not cleared this test as a stand-alone test therefore the rapid antigen negative result has reflexed to a Group A Strep culture.     Radiology Reports Ct Soft Tissue Neck W Contrast  10/05/2015  CLINICAL DATA:  Chest pain and sore throat EXAM: CT NECK WITH CONTRAST TECHNIQUE: Multidetector CT imaging of the neck was performed using the standard protocol following the bolus administration of intravenous contrast. CONTRAST:  80mL OMNIPAQUE IOHEXOL 300 MG/ML  SOLN COMPARISON:  None. FINDINGS: Pharynx and larynx: No mass or fluid collection identified. The  prevertebral soft tissues appear normal. Salivary glands: The parotid glands and submandibular glands appear symmetric. Thyroid: Normal appearance of the thyroid gland. Lymph nodes: Right level-II lymph node measures 1.3 cm, image number 46 of series 2. Vascular: Patent.  Unremarkable. Limited intracranial: Unremarkable. Visualized orbits: The orbits appear normal. Mastoids and visualized paranasal sinuses: The mastoid air cells are clear. There is mild mucosal thickening involving the left maxillary sinus Skeleton: Unremarkable. Upper chest: Lungs are clear. Superior mediastinum appears within normal limits. IMPRESSION: 1. No mass or evidence of abscess identified within the soft tissues of the neck. 2. Enlarged right level 2 lymph node is identified. Likely reactive. Electronically Signed   By: Signa Kell M.D.   On: 10/05/2015 20:42     CBC  Recent Labs Lab 10/06/15 1646 10/07/15 0525  WBC 6.1 5.1  HGB 12.5 11.7*  HCT 36.0 34.9*  PLT 135* 127*  MCV 92.5 92.1  MCH 32.1 30.9  MCHC 34.7 33.5  RDW 12.0 11.8  LYMPHSABS 1.6  --   MONOABS 0.5  --   EOSABS 0.0  --   BASOSABS 0.0  --     Chemistries   Recent Labs Lab 10/05/15 1925 10/06/15 1646 10/07/15 0525  NA 138 140 141  K 3.9 4.1 4.4  CL 103 104 106  CO2 GLUCOSE 97 88 89  BUN CREATININE 0.98 1.02* 0.98  CALCIUM 8.7* 8.9 8.6*  AST  --  19 16  ALT  --  13* 12*  ALKPHOS  --  42 37*  BILITOT  --  0.9 0.8   ------------------------------------------------------------------------------------------------------------------ estimated creatinine clearance is 85.7 mL/min (by C-G formula based on Cr of 0.98). ------------------------------------------------------------------------------------------------------------------ No results for input(s): HGBA1C in the last 72 hours. ------------------------------------------------------------------------------------------------------------------ No results for  input(s): CHOL, HDL, LDLCALC, TRIG, CHOLHDL, LDLDIRECT in the last 72 hours. ------------------------------------------------------------------------------------------------------------------ No results for input(s): TSH, T4TOTAL, T3FREE, THYROIDAB in the last 72 hours.  Invalid input(s): FREET3 ------------------------------------------------------------------------------------------------------------------ No results for input(s): VITAMINB12, FOLATE, FERRITIN, TIBC, IRON, RETICCTPCT in the last 72 hours.  Coagulation profile No results for input(s): INR, PROTIME in the last 168 hours.  No results for input(s): DDIMER in the last 72 hours.  Cardiac Enzymes No results for input(s): CKMB, TROPONINI, MYOGLOBIN in the last 168 hours.  Invalid input(s): CK ------------------------------------------------------------------------------------------------------------------ Invalid input(s): POCBNP   Time Spent in minutes 35   SINGH,PRASHANT K M.D on 10/07/2015 at 8:42 AM  Between 7am to 7pm - Pager - [phone]  After 7pm go to [email or web address] - password [email or web address]  [facility] -  Office  [phone]

## 2015-10-08 LAB — CULTURE, GROUP A STREP: Strep A Culture: NEGATIVE

## 2015-10-08 MED ORDER — BOOST PLUS PO LIQD
237.0000 mL | Freq: Three times a day (TID) | ORAL | Status: DC
  Administered 2015-10-08 – 2015-10-09 (×4): 237 mL via ORAL
  Filled 2015-10-08 (×10): qty 237

## 2015-10-08 MED ORDER — MAGIC MOUTHWASH W/LIDOCAINE
5.0000 mL | Freq: Four times a day (QID) | ORAL | Status: DC | PRN
  Administered 2015-10-08: 5 mL via ORAL
  Filled 2015-10-08 (×2): qty 5

## 2015-10-08 MED ORDER — GI COCKTAIL ~~LOC~~
30.0000 mL | Freq: Three times a day (TID) | ORAL | Status: DC
  Administered 2015-10-08 – 2015-10-09 (×3): 30 mL via ORAL
  Filled 2015-10-08 (×3): qty 30

## 2015-10-08 MED ORDER — HYDROCODONE-ACETAMINOPHEN 7.5-325 MG/15ML PO SOLN: 10.0000 mL | Freq: Three times a day (TID) | ORAL | Status: DC | PRN

## 2015-10-08 MED ORDER — PANTOPRAZOLE SODIUM 40 MG PO TBEC
40.0000 mg | DELAYED_RELEASE_TABLET | Freq: Every day | ORAL | Status: DC
  Administered 2015-10-08 – 2015-10-09 (×2): 40 mg via ORAL
  Filled 2015-10-08 (×2): qty 1

## 2015-10-08 MED ORDER — HYDROCODONE-ACETAMINOPHEN 7.5-325 MG/15ML PO SOLN
10.0000 mL | Freq: Three times a day (TID) | ORAL | Status: DC | PRN
  Filled 2015-10-08: qty 15

## 2015-10-08 NOTE — Progress Notes (Signed)
Patient Demographics:    Molly Castillo, is a 19 y.o. female, DOB - 04/21/1996, [id number]  Admit date - 10/06/2015   Admitting Physician Starleen Armsawood S Elgergawy, MD  Outpatient Primary MD for the patient is No primary care provider on file.  LOS - 2   No chief complaint on file.       Subjective:    Molly Footaitlin Albornoz today has, No headache, No chest pain, No abdominal pain - No Nausea, No new weakness tingling or numbness, No Cough - SOB. Still has throat pain and odynophagia.    Assessment  & Plan :     1.Odynophagia - clinically appears Herpes esophagitis, patient EGD by Dr. Loreta AveMann. Admitted here for IV acyclovir and hydration, unable to swallow much without discomfort. Clear liquids as tolerated, HIV and HSV serology negative, denies any sexual exposure, GI Dr. Loreta AveMann following, await outpatient EGD biopsy results, will place her on pain medications along with GI cocktail and magic mouthwash, liquid boost 4 times a day, further management per GI.   2. Dehydration. Resolved after IV fluids continue gentle hydration, continue hydration.    Code Status : Full  Family Communication  : parents  Disposition Plan  : Home 1-2 days  Consults  :  GI - Mann  Procedures  :    DVT Prophylaxis  :  Lovenox    Lab Results  Component Value Date   PLT 127* 10/07/2015    Inpatient Medications  Scheduled Meds: . acyclovir  600 mg Intravenous 3 times per day  . gi cocktail  30 mL Oral TID AC & HS  . lactose free nutrition  237 mL Oral TID WC & HS  . pantoprazole (PROTONIX) IV  40 mg Intravenous QHS   Continuous Infusions: . 0.9 % NaCl with KCl 20 mEq / L 75 mL/hr at 10/08/15 0457   PRN Meds:.HYDROcodone-acetaminophen, magic mouthwash w/lidocaine, morphine injection, ondansetron (ZOFRAN) IV **OR**  promethazine  Antibiotics  :     Anti-infectives    Start     Dose/Rate Route Frequency Ordered Stop   10/06/15 1630  acyclovir (ZOVIRAX) 600 mg in dextrose 5 % 100 mL IVPB     600 mg 112 mL/hr over 60 Minutes Intravenous 3 times per day 10/06/15 1606          Objective:   Filed Vitals:   10/07/15 0539 10/07/15 1326 10/07/15 2038 10/08/15 0452  BP: 113/57 116/68 131/60 117/50  Pulse: 58 75 66 51  Temp: 98 F (36.7 C) 98.6 F (37 C) 98.2 F (36.8 C) 97.9 F (36.6 C)  TempSrc: Oral Oral Oral Oral  Resp: 18 18 18 18   Height:      Weight:      SpO2: 97% 99% 99% 97%    Wt Readings from Last 3 Encounters:  10/06/15 68.04 kg (150 lb)  10/05/15 68.04 kg (150 lb)     Intake/Output Summary (Last 24 hours) at 10/08/15 0950 Last data filed at 10/08/15 0930  Gross per 24 hour  Intake 4545.75 ml  Output    300 ml  Net 4245.75 ml     Physical Exam  Awake Alert, Oriented X 3, No new F.N deficits, Normal affect [place].AT,PERRAL Supple Neck,No JVD, No cervical lymphadenopathy appriciated.  Symmetrical Chest wall movement, Good air movement bilaterally, CTAB RRR,No Gallops,Rubs or new Murmurs, No Parasternal Heave +ve B.Sounds, Abd Soft, No tenderness, No organomegaly appriciated, No rebound - guarding or rigidity. No Cyanosis, Clubbing or edema, No new Rash or bruise       Data Review:   Micro Results Recent Results (from the past 240 hour(s))  Rapid strep screen     Status: None   Collection Time: 10/05/15  6:06 PM  Result Value Ref Range Status   Streptococcus, Group A Screen (Direct) NEGATIVE NEGATIVE Final    Comment: (NOTE) A Rapid Antigen test may result negative if the antigen level in the sample is below the detection level of this test. The FDA has not cleared this test as a stand-alone test therefore the rapid antigen negative result has reflexed to a Group A Strep culture.     Radiology Reports Ct Soft Tissue Neck W Contrast  10/05/2015  CLINICAL DATA:   Chest pain and sore throat EXAM: CT NECK WITH CONTRAST TECHNIQUE: Multidetector CT imaging of the neck was performed using the standard protocol following the bolus administration of intravenous contrast. CONTRAST:  80mL OMNIPAQUE IOHEXOL 300 MG/ML  SOLN COMPARISON:  None. FINDINGS: Pharynx and larynx: No mass or fluid collection identified. The prevertebral soft tissues appear normal. Salivary glands: The parotid glands and submandibular glands appear symmetric. Thyroid: Normal appearance of the thyroid gland. Lymph nodes: Right level-II lymph node measures 1.3 cm, image number 46 of series 2. Vascular: Patent.  Unremarkable. Limited intracranial: Unremarkable. Visualized orbits: The orbits appear normal. Mastoids and visualized paranasal sinuses: The mastoid air cells are clear. There is mild mucosal thickening involving the left maxillary sinus Skeleton: Unremarkable. Upper chest: Lungs are clear. Superior mediastinum appears within normal limits. IMPRESSION: 1. No mass or evidence of abscess identified within the soft tissues of the neck. 2. Enlarged right level 2 lymph node is identified. Likely reactive. Electronically Signed   By: Signa Kell M.D.   On: 10/05/2015 20:42     CBC  Recent Labs Lab 10/06/15 1646 10/07/15 0525  WBC 6.1 5.1  HGB 12.5 11.7*  HCT 36.0 34.9*  PLT 135* 127*  MCV 92.5 92.1  MCH 32.1 30.9  MCHC 34.7 33.5  RDW 12.0 11.8  LYMPHSABS 1.6  --   MONOABS 0.5  --   EOSABS 0.0  --   BASOSABS 0.0  --     Chemistries   Recent Labs Lab 10/05/15 1925 10/06/15 1646 10/07/15 0525  NA 138 140 141  K 3.9 4.1 4.4  CL 103 104 106  CO2 GLUCOSE 97 88 89  BUN CREATININE 0.98 1.02* 0.98  CALCIUM 8.7* 8.9 8.6*  AST  --  19 16  ALT  --  13* 12*  ALKPHOS  --  42 37*  BILITOT  --  0.9 0.8   ------------------------------------------------------------------------------------------------------------------ estimated creatinine clearance is 85.7 mL/min  (by C-G formula based on Cr of 0.98). ------------------------------------------------------------------------------------------------------------------ No results for input(s): HGBA1C in the last 72 hours. ------------------------------------------------------------------------------------------------------------------ No results for input(s): CHOL, HDL, LDLCALC, TRIG, CHOLHDL, LDLDIRECT in the last 72 hours. ------------------------------------------------------------------------------------------------------------------ No results for input(s): TSH, T4TOTAL, T3FREE, THYROIDAB in the last 72 hours.  Invalid input(s): FREET3 ------------------------------------------------------------------------------------------------------------------ No results for input(s): VITAMINB12, FOLATE, FERRITIN, TIBC, IRON, RETICCTPCT in the last 72 hours.  Coagulation profile No results for input(s): INR, PROTIME in the last 168 hours.  No results for input(s): DDIMER in the last 72 hours.  Cardiac Enzymes No results for input(s): CKMB, TROPONINI, MYOGLOBIN in the last 168 hours.  Invalid input(s): CK ------------------------------------------------------------------------------------------------------------------ Invalid input(s): POCBNP   Time Spent in minutes 35   Lemmie Steinhaus K M.D on 10/08/2015 at 9:50 AM  Between 7am to 7pm - Pager - [phone]  After 7pm go to [email or web address] - password [facility]  [facility] -  Office  [phone]

## 2015-10-08 NOTE — Progress Notes (Signed)
Subjective: Feeling better.  She was able to tolerate a pancake this AM.  Liquids seem to cause her more problems.  Objective: Vital signs in last 24 hours: Temp:  [97.9 F (36.6 C)-98.2 F (36.8 C)] 97.9 F (36.6 C) (12/29 0452) Pulse Rate:  [51-66] 51 (12/29 0452) Resp:  [18] 18 (12/29 0452) BP: (117-131)/(50-60) 117/50 mmHg (12/29 0452) SpO2:  [97 %-99 %] 97 % (12/29 0452)    Intake/Output from previous day: 12/28 0701 - 12/29 0700 In: 4545.8 [P.O.:220; I.V.:3653.8; IV Piggyback:672] Out: 1300 [Urine:1300] Intake/Output this shift: Total I/O In: 120 [P.O.:120] Out: 600 [Urine:600]  General appearance: alert and no distress GI: soft, non-tender; bowel sounds normal; no masses,  no organomegaly  Lab Results:  Recent Labs  10/06/15 1646 10/07/15 0525  WBC 6.1 5.1  HGB 12.5 11.7*  HCT 36.0 34.9*  PLT 135* 127*   BMET  Recent Labs  10/05/15 1925 10/06/15 1646 10/07/15 0525  NA 138 140 141  K 3.9 4.1 4.4  CL 103 104 106  CO2 29 28 29   GLUCOSE 97 88 89  BUN 19 18 19   CREATININE 0.98 1.02* 0.98  CALCIUM 8.7* 8.9 8.6*   LFT  Recent Labs  10/07/15 0525  PROT 5.4*  ALBUMIN 2.9*  AST 16  ALT 12*  ALKPHOS 37*  BILITOT 0.8   PT/INR No results for input(s): LABPROT, INR in the last 72 hours. Hepatitis Panel No results for input(s): HEPBSAG, HCVAB, HEPAIGM, HEPBIGM in the last 72 hours. C-Diff No results for input(s): CDIFFTOX in the last 72 hours. Fecal Lactopherrin No results for input(s): FECLLACTOFRN in the last 72 hours.  Studies/Results: No results found.  Medications:  Scheduled: . acyclovir  600 mg Intravenous 3 times per day  . gi cocktail  30 mL Oral TID AC & HS  . lactose free nutrition  237 mL Oral TID WC & HS  . pantoprazole  40 mg Oral Daily   Continuous: . 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 2) Nausea/Vomiting - improved.  3)  Dehydration improved.   I had a long discussion with the patient and her parents.  I will advance her diet to a soft diet and increase as tolerated.  If she can tolerate PO without vomiting or significant odynophagia, I believe she can be discharged tomorrow AM.  Plan: 1) Advance diet. 2) Continue with acyclovir.  LOS: 2 days   Yuli Lanigan D 10/08/2015, 4:44 PM

## 2015-10-09 LAB — BASIC METABOLIC PANEL
Anion gap: 6 (ref 5–15)
BUN: 15 mg/dL (ref 6–20)
CHLORIDE: 107 mmol/L (ref 101–111)
CO2: 29 mmol/L (ref 22–32)
Calcium: 8.8 mg/dL — ABNORMAL LOW (ref 8.9–10.3)
Creatinine, Ser: 1.07 mg/dL — ABNORMAL HIGH (ref 0.44–1.00)
GFR calc Af Amer: >60 mL/min (ref >60)
GFR calc non Af Amer: >60 mL/min (ref >60)
GLUCOSE: 96 mg/dL (ref 65–99)
POTASSIUM: 4.9 mmol/L (ref 3.5–5.1)
SODIUM: 142 mmol/L (ref 135–145)

## 2015-10-09 LAB — CBC
HCT: 36.5 % (ref 36.0–46.0)
HEMOGLOBIN: 12.7 g/dL (ref 12.0–15.0)
MCH: 31.2 pg (ref 26.0–34.0)
MCHC: 34.8 g/dL (ref 30.0–36.0)
MCV: 89.7 fL (ref 78.0–100.0)
Platelets: 157 10*3/uL (ref 150–400)
RBC: 4.07 MIL/uL (ref 3.87–5.11)
RDW: 11.7 % (ref 11.5–15.5)
WBC: 4.8 10*3/uL (ref 4.0–10.5)

## 2015-10-09 MED ORDER — ACYCLOVIR 400 MG PO TABS: 400.0000 mg | ORAL_TABLET | Freq: Every day | ORAL | Status: AC

## 2015-10-09 MED ORDER — GI COCKTAIL ~~LOC~~: 30.0000 mL | Freq: Three times a day (TID) | ORAL | Status: AC

## 2015-10-09 NOTE — Progress Notes (Signed)
D/c to home w/ mother ambulating per request.d/c instructions given and reviewed

## 2015-10-09 NOTE — Discharge Instructions (Signed)
Follow with Primary MD in 7 days   Get CBC, CMP, 2 view Chest X ray checked  by Primary MD next visit.    Activity: As tolerated with Full fall precautions use walker/cane & assistance as needed   Disposition Home     Diet:   Soft with aspiration precautions.  For Heart failure patients - Check your Weight same time everyday, if you gain over 2 pounds, or you develop in leg swelling, experience more shortness of breath or chest pain, call your Primary MD immediately. Follow Cardiac Low Salt Diet and 1.5 lit/day fluid restriction.   On your next visit with your primary care physician please Get Medicines reviewed and adjusted.   Please request your Prim.MD to go over all Hospital Tests and Procedure/Radiological results at the follow up, please get all Hospital records sent to your Prim MD by signing hospital release before you go home.   If you experience worsening of your admission symptoms, develop shortness of breath, life threatening emergency, suicidal or homicidal thoughts you must seek medical attention immediately by calling 911 or calling your MD immediately  if symptoms less severe.  You Must read complete instructions/literature along with all the possible adverse reactions/side effects for all the Medicines you take and that have been prescribed to you. Take any new Medicines after you have completely understood and accpet all the possible adverse reactions/side effects.   Do not drive, operating heavy machinery, perform activities at heights, swimming or participation in water activities or provide baby sitting services if your were admitted for syncope or siezures until you have seen by Primary MD or a Neurologist and advised to do so again.  Do not drive when taking Pain medications.    Do not take more than prescribed Pain, Sleep and Anxiety Medications  Special Instructions: If you have smoked or chewed Tobacco  in the last 2 yrs please stop smoking, stop any regular  Alcohol  and or any Recreational drug use.  Wear Seat belts while driving.   Please note  You were cared for by a hospitalist during your hospital stay. If you have any questions about your discharge medications or the care you received while you were in the hospital after you are discharged, you can call the unit and asked to speak with the hospitalist on call if the hospitalist that took care of you is not available. Once you are discharged, your primary care physician will handle any further medical issues. Please note that NO REFILLS for any discharge medications will be authorized once you are discharged, as it is imperative that you return to your primary care physician (or establish a relationship with a primary care physician if you do not have one) for your aftercare needs so that they can reassess your need for medications and monitor your lab values.

## 2015-10-09 NOTE — Progress Notes (Signed)
Subjective: The patient was able to eat more substantive food without significant odynophagia.  Feels well.  Objective: Vital signs in last 24 hours: Temp:  [97.7 F (36.5 C)-97.9 F (36.6 C)] 97.7 F (36.5 C) (12/30 0539) Pulse Rate:  [49-89] 89 (12/30 0540) Resp:  [18] 18 (12/30 0539) BP: (118-125)/(55-65) 125/65 mmHg (12/30 0539) SpO2:  [84 %-97 %] 84 % (12/30 0540)    Intake/Output from previous day: 12/29 0701 - 12/30 0700 In: 120 [P.O.:120] Out: 600 [Urine:600] Intake/Output this shift:    General appearance: no distress GI: soft, non-tender; bowel sounds normal; no masses,  no organomegaly  Lab Results:  Recent Labs  10/06/15 1646 10/07/15 0525 10/09/15 0529  WBC 6.1 5.1 4.8  HGB 12.5 11.7* 12.7  HCT 36.0 34.9* 36.5  PLT 135* 127* 157   BMET  Recent Labs  10/06/15 1646 10/07/15 0525 10/09/15 0529  NA 140 141 142  K 4.1 4.4 4.9  CL 104 106 107  CO2 28 29 29   GLUCOSE 88 89 96  BUN 18 19 15   CREATININE 1.02* 0.98 1.07*  CALCIUM 8.9 8.6* 8.8*   LFT  Recent Labs  10/07/15 0525  PROT 5.4*  ALBUMIN 2.9*  AST 16  ALT 12*  ALKPHOS 37*  BILITOT 0.8   PT/INR No results for input(s): LABPROT, INR in the last 72 hours. Hepatitis Panel No results for input(s): HEPBSAG, HCVAB, HEPAIGM, HEPBIGM in the last 72 hours. C-Diff No results for input(s): CDIFFTOX in the last 72 hours. Fecal Lactopherrin No results for input(s): FECLLACTOFRN in the last 72 hours.  Studies/Results: No results found.  Medications:  Scheduled: . acyclovir  600 mg Intravenous 3 times per day  . gi cocktail  30 mL Oral TID AC & HS  . lactose free nutrition  237 mL Oral TID WC & HS  . pantoprazole  40 mg Oral Daily   Continuous: . 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   The patient is well today and I believe she can be safely discharged home today.  She is tolerating PO without significant  difficulty.    Plan: 1) Okay to D/C home. 2) D/C with acyclovir 400 mg TID x 8 days (total of 10 day treatment). 3) Follow up with Dr. Loreta Castillo in 2 weeks. 4) No need for pantoprazole as GERD is not an issue for her, but she can be discharged with a GI cocktail for PRN use during this treatment phase.   LOS: 3 days   Molly Castillo D 10/09/2015, 7:31 AM

## 2015-10-09 NOTE — Discharge Summary (Signed)
Molly Castillo, is a 19 y.o. female  DOB 05/01/1996  MRN [id number].  Admission date:  10/06/2015  Admitting Physician  Molly Armsawood S Elgergawy, MD  Discharge Date:  10/09/2015   Primary MD  No primary care provider on file.  Recommendations for primary care physician for things to follow:   Follow with GI in 2-3 days   Admission Diagnosis  esophagitis   Discharge Diagnosis  esophagitis    Principal Problem:   Dehydration, severe Active Problems:   Herpes simplex esophagitis   Odynophagia      Past Medical History  Diagnosis Date  . Iron deficiency   . Esophageal ulcer     Past Surgical History  Procedure Laterality Date  . Esophagogastroduodenoscopy         HPI  from the history and physical done on the day of admission:   Molly HeinzCain is a 19 year old white female presents to the office for evaluation of severe chest pain and acute difficulty swallowing with painful swallowing which started 5 days ago. Currently she is unable to swallow even a sip of water. She has had mild dysphagia off and on for the last 6 years. She was in [facility] ED at [facility] 2 days ago. She was treated for Streptococcal infection on October 02, 2015 and had a fever of 103 degrees when the chest pain started. She is currently taking Amoxicillin even though a Strep test was negative. She was given a steroid injection along with Morphine yesterday.   She was given Ketorolac in the ER and she has taken one dose. She just started taking Famotidine over the last few days and has acid reflux off and on for several years. She has had iron deficiency anemia for the last 4 years and her hemoglobin has decreased from 16.3 gm/dl in 8/46963/2012 to 29.515.5 gm/dl in 2/84137/2013 and 24.414.7 gm/dl 0/10278/2013 to 25.311.7 gm/dl today. She had a normal TTG about 2  years ago. She was taking an Iron supplement along with Fish oil, Magnesium and CoQ10 but has stopped taking them. She has 1 BM per day with no obvious blood or mucus in the stool. She is unable to eat due to the acute dysphagia and has lost 8 pounds in the last 5 days. She denies having any abdominal pain, nausea, vomiting or odynophagia. She denies having a family history of colon cancer, celiac sprue or IBD. 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     Hospital Course:     1.Odynophagia - clinically appears Herpes esophagitis, patient EGD by Dr. Loreta Castillo. Admitted here for IV acyclovir and hydration, he is much better after GI cocktail and able to swallow soft diet and clear liquids, she was seen by GI cleared for discharge on oral acyclovir 400 mg 3 times a day for 8 more days. Outpatient pathology results are pending and she will follow with her gastroenterologist within 3-4 days for final diagnosis and postdischarge follow-up. She is symptom free plan was  discussed with parents bedside. HIV serology and HSV serology were negative in the hospital.   2. Dehydration. Resolved after IV fluids continue gentle hydration, continue hydration.        Discharge Condition: Stable  Follow UP  Follow-up Information    Follow up with PCP. Schedule an appointment as soon as possible for a visit in 3 days.      Follow up with MANN,JYOTHI, MD. Schedule an appointment as soon as possible for a visit in 3 days.   Specialty:  Gastroenterology   Contact information:   [address] [place] [address] [phone]        Consults obtained - GI  Diet and Activity recommendation: See Discharge Instructions below  Discharge Instructions       Discharge Instructions    Discharge instructions    Complete by:  As directed   Follow with Primary MD in 7 days   Get CBC, CMP, 2 view Chest X ray checked  by Primary MD next visit.    Activity: As tolerated with Full fall  precautions use walker/cane & assistance as needed   Disposition Home     Diet:   Soft with aspiration precautions.  For Heart failure patients - Check your Weight same time everyday, if you gain over 2 pounds, or you develop in leg swelling, experience more shortness of breath or chest pain, call your Primary MD immediately. Follow Cardiac Low Salt Diet and 1.5 lit/day fluid restriction.   On your next visit with your primary care physician please Get Medicines reviewed and adjusted.   Please request your Prim.MD to go over all Hospital Tests and Procedure/Radiological results at the follow up, please get all Hospital records sent to your Prim MD by signing hospital release before you go home.   If you experience worsening of your admission symptoms, develop shortness of breath, life threatening emergency, suicidal or homicidal thoughts you must seek medical attention immediately by calling 911 or calling your MD immediately  if symptoms less severe.  You Must read complete instructions/literature along with all the possible adverse reactions/side effects for all the Medicines you take and that have been prescribed to you. Take any new Medicines after you have completely understood and accpet all the possible adverse reactions/side effects.   Do not drive, operating heavy machinery, perform activities at heights, swimming or participation in water activities or provide baby sitting services if your were admitted for syncope or siezures until you have seen by Primary MD or a Neurologist and advised to do so again.  Do not drive when taking Pain medications.    Do not take more than prescribed Pain, Sleep and Anxiety Medications  Special Instructions: If you have smoked or chewed Tobacco  in the last 2 yrs please stop smoking, stop any regular Alcohol  and or any Recreational drug use.  Wear Seat belts while driving.   Please note  You were cared for by a hospitalist during your  hospital stay. If you have any questions about your discharge medications or the care you received while you were in the hospital after you are discharged, you can call the unit and asked to speak with the hospitalist on call if the hospitalist that took care of you is not available. Once you are discharged, your primary care physician will handle any further medical issues. Please note that NO REFILLS for any discharge medications will be authorized once you are discharged, as it is imperative that  you return to your primary care physician (or establish a relationship with a primary care physician if you do not have one) for your aftercare needs so that they can reassess your need for medications and monitor your lab values.     Increase activity slowly    Complete by:  As directed              Discharge Medications       Medication List    STOP taking these medications        ACANYA gel  Generic drug:  Clindamycin Phos-Benzoyl Perox     amoxicillin 500 MG capsule  Commonly known as:  AMOXIL     cetirizine 10 MG tablet  Commonly known as:  ZYRTEC     COQ10 PO     EPINEPHRINE (ANAPHYLAXIS THERAPY AGENTS)     famotidine 20 MG tablet  Commonly known as:  PEPCID     ibuprofen 200 MG tablet  Commonly known as:  ADVIL,MOTRIN     ketorolac 10 MG tablet  Commonly known as:  TORADOL     lidocaine 2 % solution  Commonly known as:  XYLOCAINE     MAGNESIUM PO     TRIAMCINOLONE ACETONIDE (PF) IJ     ZITHROMAX 500 MG tablet  Generic drug:  azithromycin      TAKE these medications        acyclovir 400 MG tablet  Commonly known as:  ZOVIRAX  Take 1 tablet (400 mg total) by mouth 5 (five) times daily.     Fish Oil 1000 MG Caps  Take 1,000 mg by mouth 2 (two) times daily.     gi cocktail Susp suspension  Take 30 mLs by mouth 4 (four) times daily -  before meals and at bedtime. Shake well.  Pharmacy can substitue     IRON-VITAMIN C PO  Take 1 tablet by mouth daily.          Major procedures and Radiology Reports - PLEASE review detailed and final reports for all details, in brief -       Ct Soft Tissue Neck W Contrast  10/05/2015  CLINICAL DATA:  Chest pain and sore throat EXAM: CT NECK WITH CONTRAST TECHNIQUE: Multidetector CT imaging of the neck was performed using the standard protocol following the bolus administration of intravenous contrast. CONTRAST:  80mL OMNIPAQUE IOHEXOL 300 MG/ML  SOLN COMPARISON:  None. FINDINGS: Pharynx and larynx: No mass or fluid collection identified. The prevertebral soft tissues appear normal. Salivary glands: The parotid glands and submandibular glands appear symmetric. Thyroid: Normal appearance of the thyroid gland. Lymph nodes: Right level-II lymph node measures 1.3 cm, image number 46 of series 2. Vascular: Patent.  Unremarkable. Limited intracranial: Unremarkable. Visualized orbits: The orbits appear normal. Mastoids and visualized paranasal sinuses: The mastoid air cells are clear. There is mild mucosal thickening involving the left maxillary sinus Skeleton: Unremarkable. Upper chest: Lungs are clear. Superior mediastinum appears within normal limits. IMPRESSION: 1. No mass or evidence of abscess identified within the soft tissues of the neck. 2. Enlarged right level 2 lymph node is identified. Likely reactive. Electronically Signed   By: Signa Kell M.D.   On: 10/05/2015 20:42    Micro Results      Recent Results (from the past 240 hour(s))  Rapid strep screen     Status: None   Collection Time: 10/05/15  6:06 PM  Result Value Ref Range Status   Streptococcus, Group A Screen (Direct) NEGATIVE  NEGATIVE Final    Comment: (NOTE) A Rapid Antigen test may result negative if the antigen level in the sample is below the detection level of this test. The FDA has not cleared this test as a stand-alone test therefore the rapid antigen negative result has reflexed to a Group A Strep culture.   Culture, Group A Strep      Status: None   Collection Time: 10/05/15  6:06 PM  Result Value Ref Range Status   Strep A Culture Negative  Final    Comment: (NOTE) Performed At: [facility] [address] [place], [place] [id number] Mila Homer MD [id number]     Today   Subjective    Lavita Pontius today has no headache,no chest abdominal pain,no new weakness tingling or numbness, feels much better wants to go home today.     Objective   Blood pressure 125/65, pulse 89, temperature 97.7 F (36.5 C), temperature source Oral, resp. rate 18, height 5\' 6"  (1.676 m), weight 68.04 kg (150 lb), last menstrual period 10/05/2015, SpO2 84 %.   Intake/Output Summary (Last 24 hours) at 10/09/15 1003 Last data filed at 10/08/15 1456  Gross per 24 hour  Intake    120 ml  Output    600 ml  Net   -480 ml    Exam Awake Alert, Oriented x 3, No new F.N deficits, Normal affect [place].AT,PERRAL Supple Neck,No JVD, No cervical lymphadenopathy appriciated.  Symmetrical Chest wall movement, Good air movement bilaterally, CTAB RRR,No Gallops,Rubs or new Murmurs, No Parasternal Heave +ve B.Sounds, Abd Soft, Non tender, No organomegaly appriciated, No rebound -guarding or rigidity. No Cyanosis, Clubbing or edema, No new Rash or bruise   Data Review   CBC w Diff: Lab Results  Component Value Date   WBC 4.8 10/09/2015   HGB 12.7 10/09/2015   HCT 36.5 10/09/2015   PLT 157 10/09/2015   LYMPHOPCT 26 10/06/2015   MONOPCT 8 10/06/2015   EOSPCT 1 10/06/2015   BASOPCT 1 10/06/2015    CMP: Lab Results  Component Value Date   NA 142 10/09/2015   K 4.9 10/09/2015   CL 107 10/09/2015   CO2 29 10/09/2015   BUN 15 10/09/2015   CREATININE 1.07* 10/09/2015   PROT 5.4* 10/07/2015   ALBUMIN 2.9* 10/07/2015   BILITOT 0.8 10/07/2015   ALKPHOS 37* 10/07/2015   AST 16 10/07/2015   ALT 12* 10/07/2015  .   Total Time in preparing paper work, data evaluation and todays exam - 35 minutes  Leroy Sea M.D on  10/09/2015 at 10:03 AM  [facility]   Office  [phone]

## 2016-10-20 IMAGING — CT CT NECK W/ CM
2 of 3 series · 9 of 14 positions shown, 11 images · IV contrast (omnipaque)
Comparison: None.

CLINICAL DATA: Chest pain and sore throat

EXAM:
CT NECK WITH CONTRAST
TECHNIQUE: Multidetector CT imaging of the neck was performed using the
standard protocol following the bolus administration of intravenous
contrast.
CONTRAST:  80mL OMNIPAQUE IOHEXOL 300 MG/ML  SOLN

[Series 2: neck 2.0 b31s · axial · 0.47mm/px · z∈[-245,-95]mm · 4 of 125 slices shown]
[im 25/125  bone]
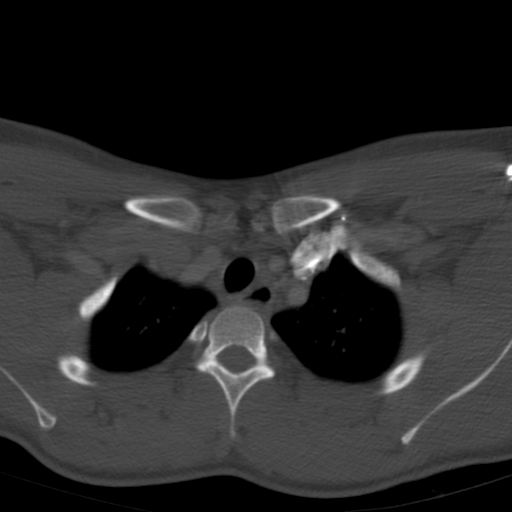
[im 50/125  bone]
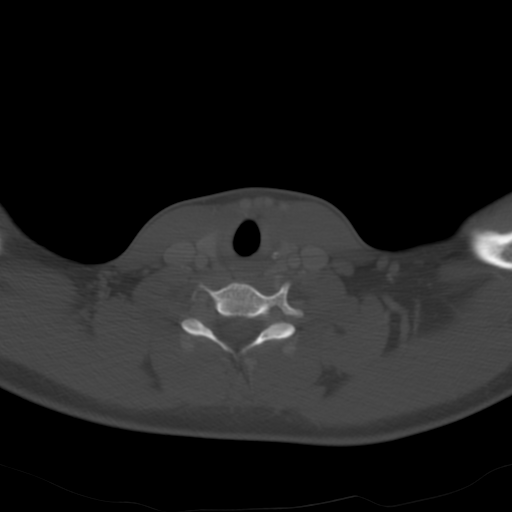
[im 75/125  bone]
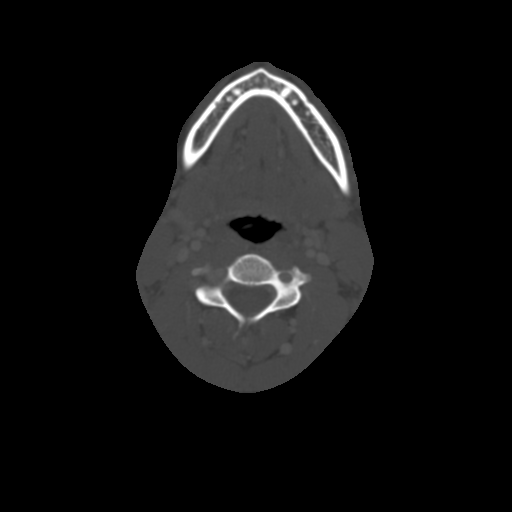
[im 100/125  bone]
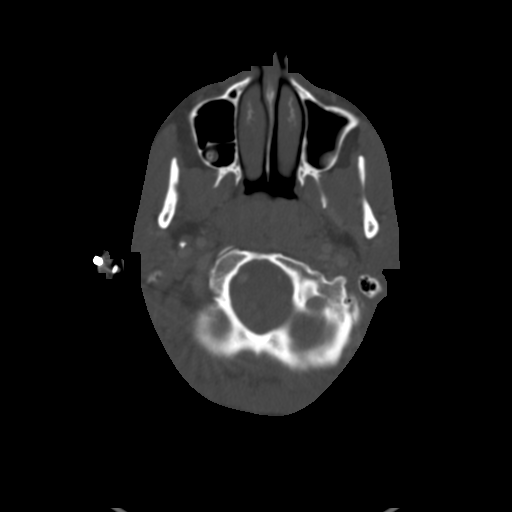

[Series 7: neck 2.0 orth axial to hyoid · axial · 0.25mm/px · z∈[-279,-92]mm · 5 of 146 slices shown, 7 images]
[im 25/146  soft-tissue]
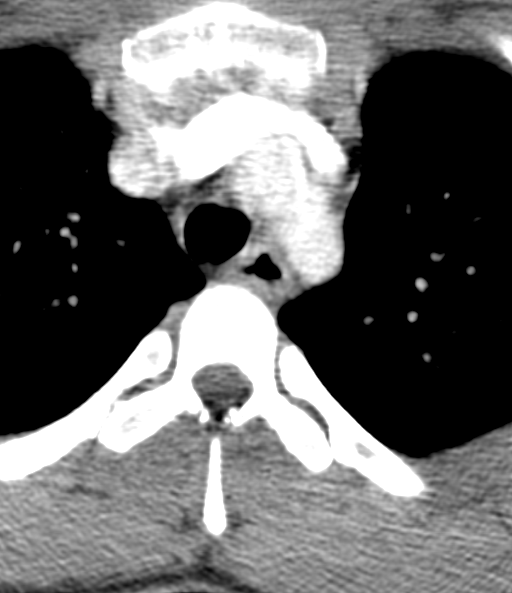
[im 25/146  bone]
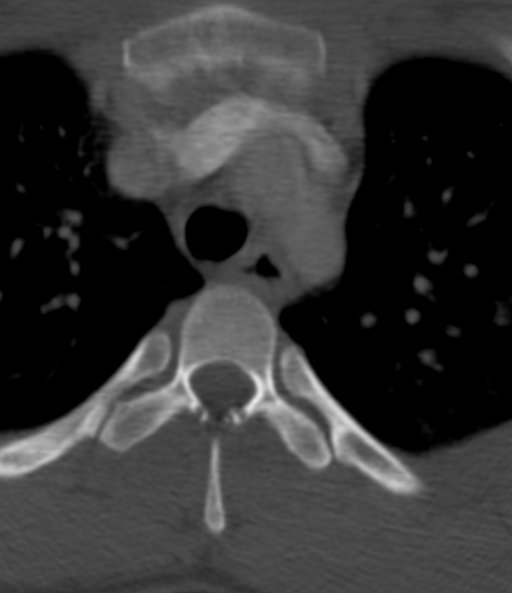
[im 49/146  bone]
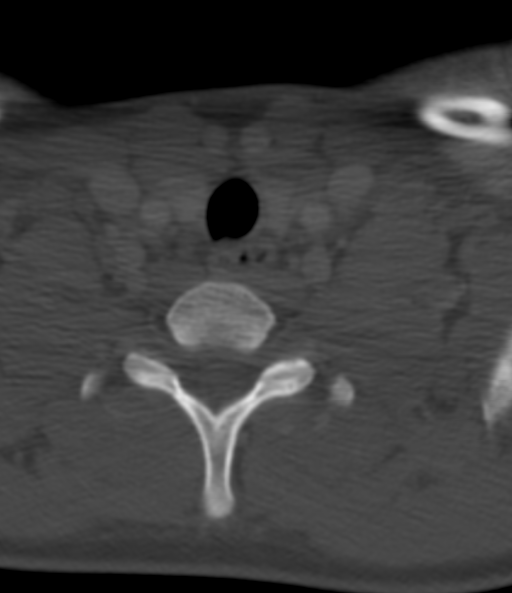
[im 73/146  bone]
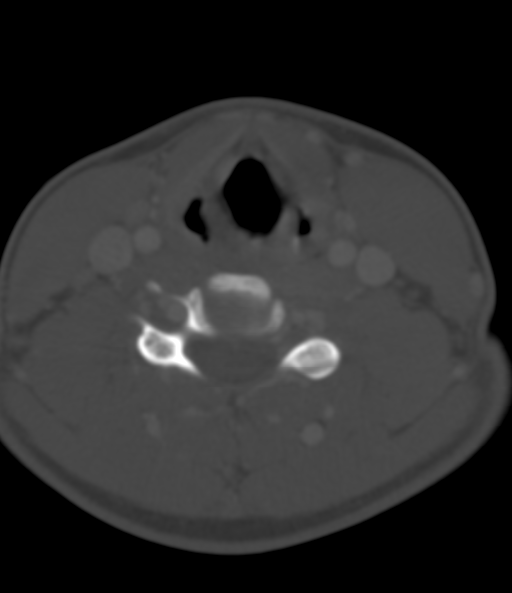
[im 97/146  bone]
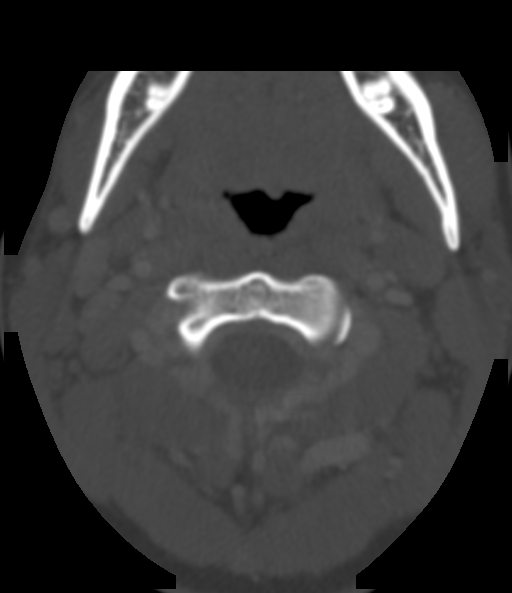
[im 121/146  soft-tissue]
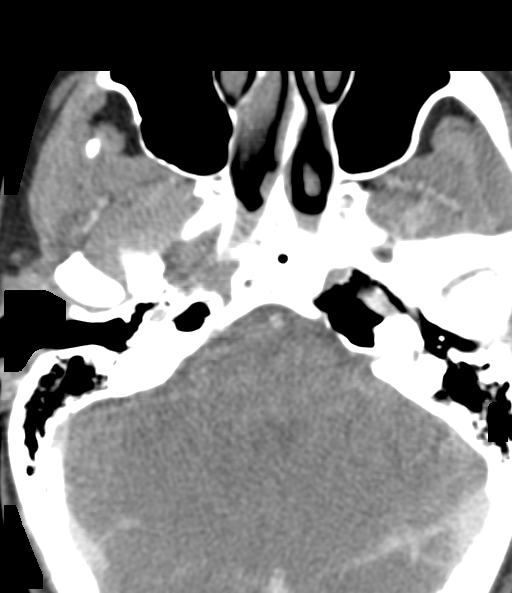
[im 121/146  bone]
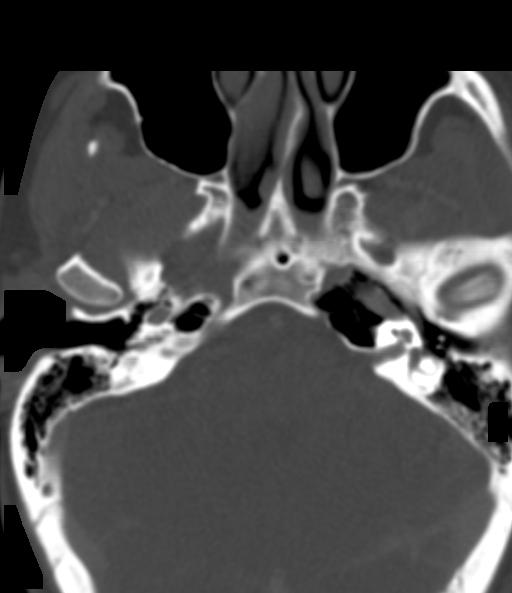

[9 of 14 positions shown; findings below may reference images not displayed]

FINDINGS: Pharynx and larynx: No mass or fluid collection identified. The
prevertebral soft tissues appear normal.

Salivary glands: The parotid glands and submandibular glands appear
symmetric.

Thyroid: Normal appearance of the thyroid gland.

Lymph nodes: Right level-II lymph node measures 1.3 cm, image number
46 of series 2.

Vascular: Patent.  Unremarkable.

Limited intracranial: Unremarkable.

Visualized orbits: The orbits appear normal.

Mastoids and visualized paranasal sinuses: The mastoid air cells are
clear. There is mild mucosal thickening involving the left maxillary
sinus

Skeleton: Unremarkable.

Upper chest: Lungs are clear. Superior mediastinum appears within
normal limits.
IMPRESSION: 1. No mass or evidence of abscess identified within the soft tissues
of the neck.
2. Enlarged right level 2 lymph node is identified. Likely reactive.

## 2018-10-01 DIAGNOSIS — R5383 Other fatigue: Secondary | ICD-10-CM | POA: Diagnosis not present

## 2018-10-01 DIAGNOSIS — L659 Nonscarring hair loss, unspecified: Secondary | ICD-10-CM | POA: Diagnosis not present

## 2018-12-27 DIAGNOSIS — R0989 Other specified symptoms and signs involving the circulatory and respiratory systems: Secondary | ICD-10-CM | POA: Diagnosis not present

## 2019-05-13 DIAGNOSIS — Z01419 Encounter for gynecological examination (general) (routine) without abnormal findings: Secondary | ICD-10-CM | POA: Diagnosis not present

## 2019-05-13 DIAGNOSIS — Z113 Encounter for screening for infections with a predominantly sexual mode of transmission: Secondary | ICD-10-CM | POA: Diagnosis not present

## 2019-05-13 DIAGNOSIS — Z6821 Body mass index (BMI) 21.0-21.9, adult: Secondary | ICD-10-CM | POA: Diagnosis not present

## 2019-05-13 DIAGNOSIS — Z3040 Encounter for surveillance of contraceptives, unspecified: Secondary | ICD-10-CM | POA: Diagnosis not present

## 2019-05-14 DIAGNOSIS — Z01419 Encounter for gynecological examination (general) (routine) without abnormal findings: Secondary | ICD-10-CM | POA: Diagnosis not present

## 2020-07-19 DIAGNOSIS — K0501 Acute gingivitis, non-plaque induced: Secondary | ICD-10-CM | POA: Diagnosis not present

## 2020-07-19 DIAGNOSIS — N390 Urinary tract infection, site not specified: Secondary | ICD-10-CM | POA: Diagnosis not present
